# Patient Record
Sex: Female | Born: 1975
Health system: Southern US, Community
[De-identification: ages and names within clinical notes are randomized; demographics above are authoritative.]

## PROBLEM LIST (undated history)

## (undated) DIAGNOSIS — T7840XA Allergy, unspecified, initial encounter: Secondary | ICD-10-CM

## (undated) DIAGNOSIS — F329 Major depressive disorder, single episode, unspecified: Secondary | ICD-10-CM

## (undated) DIAGNOSIS — F32A Depression, unspecified: Secondary | ICD-10-CM

## (undated) DIAGNOSIS — F3281 Premenstrual dysphoric disorder: Secondary | ICD-10-CM

## (undated) DIAGNOSIS — I10 Essential (primary) hypertension: Secondary | ICD-10-CM

## (undated) DIAGNOSIS — F419 Anxiety disorder, unspecified: Secondary | ICD-10-CM

## (undated) HISTORY — DX: Allergy, unspecified, initial encounter: T78.40XA

## (undated) HISTORY — DX: Premenstrual dysphoric disorder: F32.81

## (undated) HISTORY — DX: Major depressive disorder, single episode, unspecified: F32.9

## (undated) HISTORY — PX: TUBAL LIGATION: SHX77

## (undated) HISTORY — DX: Anxiety disorder, unspecified: F41.9

## (undated) HISTORY — DX: Depression, unspecified: F32.A

---

## 2006-06-30 ENCOUNTER — Ambulatory Visit: Payer: Self-pay | Admitting: Obstetrics and Gynecology

## 2006-09-20 ENCOUNTER — Ambulatory Visit: Payer: Self-pay | Admitting: Obstetrics and Gynecology

## 2006-11-28 ENCOUNTER — Ambulatory Visit: Payer: Self-pay | Admitting: Obstetrics and Gynecology

## 2006-12-26 ENCOUNTER — Ambulatory Visit: Payer: Self-pay | Admitting: Obstetrics and Gynecology

## 2007-01-06 ENCOUNTER — Inpatient Hospital Stay: Payer: Self-pay | Admitting: Obstetrics and Gynecology

## 2010-05-28 ENCOUNTER — Observation Stay: Payer: Self-pay | Admitting: Obstetrics & Gynecology

## 2010-06-01 ENCOUNTER — Inpatient Hospital Stay: Payer: Self-pay | Admitting: Obstetrics & Gynecology

## 2010-06-03 LAB — PATHOLOGY REPORT

## 2010-09-22 ENCOUNTER — Emergency Department: Payer: Self-pay | Admitting: Emergency Medicine

## 2011-11-14 LAB — HM PAP SMEAR: HM Pap smear: NORMAL

## 2011-11-15 ENCOUNTER — Other Ambulatory Visit: Payer: Self-pay | Admitting: Psychiatry

## 2011-11-15 LAB — CBC WITH DIFFERENTIAL/PLATELET
Basophil #: 0.1 10*3/uL (ref 0.0–0.1)
Eosinophil %: 2.1 %
HCT: 36.3 % (ref 35.0–47.0)
HGB: 12.1 g/dL (ref 12.0–16.0)
Lymphocyte %: 22.5 %
MCH: 28 pg (ref 26.0–34.0)
MCHC: 33.3 g/dL (ref 32.0–36.0)
Monocyte #: 0.4 10*3/uL (ref 0.0–0.7)
Monocyte %: 6.2 %
Neutrophil #: 4.5 10*3/uL (ref 1.4–6.5)
Platelet: 278 10*3/uL (ref 150–440)
WBC: 6.6 10*3/uL (ref 3.6–11.0)

## 2011-11-15 LAB — SODIUM: Sodium: 142 mmol/L (ref 136–145)

## 2012-07-09 ENCOUNTER — Ambulatory Visit: Payer: Self-pay | Admitting: Internal Medicine

## 2012-07-23 ENCOUNTER — Other Ambulatory Visit (HOSPITAL_COMMUNITY)
Admission: RE | Admit: 2012-07-23 | Discharge: 2012-07-23 | Disposition: A | Discharge status: Home / Self Care | Payer: PRIVATE HEALTH INSURANCE | Source: Ambulatory Visit | Attending: Internal Medicine | Admitting: Internal Medicine

## 2012-07-23 ENCOUNTER — Ambulatory Visit (INDEPENDENT_AMBULATORY_CARE_PROVIDER_SITE_OTHER): Payer: PRIVATE HEALTH INSURANCE | Admitting: Internal Medicine

## 2012-07-23 ENCOUNTER — Encounter: Payer: Self-pay | Admitting: Internal Medicine

## 2012-07-23 VITALS — BP 122/82 | HR 84 | Temp 98.7°F | Resp 12 | Ht 66.0 in | Wt 195.0 lb

## 2012-07-23 DIAGNOSIS — R87810 Cervical high risk human papillomavirus (HPV) DNA test positive: Secondary | ICD-10-CM | POA: Insufficient documentation

## 2012-07-23 DIAGNOSIS — N943 Premenstrual tension syndrome: Secondary | ICD-10-CM

## 2012-07-23 DIAGNOSIS — E669 Obesity, unspecified: Secondary | ICD-10-CM | POA: Insufficient documentation

## 2012-07-23 DIAGNOSIS — Z1239 Encounter for other screening for malignant neoplasm of breast: Secondary | ICD-10-CM

## 2012-07-23 DIAGNOSIS — F3281 Premenstrual dysphoric disorder: Secondary | ICD-10-CM | POA: Insufficient documentation

## 2012-07-23 DIAGNOSIS — Z1151 Encounter for screening for human papillomavirus (HPV): Secondary | ICD-10-CM | POA: Insufficient documentation

## 2012-07-23 DIAGNOSIS — Z01419 Encounter for gynecological examination (general) (routine) without abnormal findings: Secondary | ICD-10-CM | POA: Insufficient documentation

## 2012-07-23 MED ORDER — FLUOXETINE HCL 20 MG PO CAPS: 20.0000 mg | ORAL_CAPSULE | Freq: Every day | ORAL | Status: DC

## 2012-07-23 NOTE — Progress Notes (Signed)
Patient ID: Nancy Leon, female   DOB: 09-11-1976, 36 y.o.   MRN: [id number]    Subjective:     Nancy Leon is a 36 y.o. female and is here for a comprehensive physical exam. The patient reports no problems.  History   Social History  . Marital Status: Married    Spouse Name: N/A    Number of Children: N/A  . Years of Education: N/A   Occupational History  . Not on file.   Social History Main Topics  . Smoking status: Never Smoker   . Smokeless tobacco: Not on file  . Alcohol Use: Yes  . Drug Use: No  . Sexually Active: Not on file   Other Topics Concern  . Not on file   Social History Narrative  . No narrative on file   No health maintenance topics applied.  The following portions of the patient's history were reviewed and updated as appropriate: allergies, current medications, past family history, past medical history, past social history, past surgical history and problem list.  Review of Systems A comprehensive review of systems was negative.   Objective:   BP 122/82  Pulse 84  Temp 98.7 F (37.1 C)  Resp 12  Ht 5\' 6"  (1.676 m)  Wt 195 lb (88.451 kg)  BMI 31.47 kg/m2  SpO2 96%  LMP 07/08/2012  General Appearance:    Alert, cooperative, no distress, appears stated age  Head:    Normocephalic, without obvious abnormality, atraumatic  Eyes:    PERRL, conjunctiva/corneas clear, EOM's intact, fundi    benign, both eyes  Ears:    Normal TM's and external ear canals, both ears  Nose:   Nares normal, septum midline, mucosa normal, no drainage    or sinus tenderness  Throat:   Lips, mucosa, and tongue normal; teeth and gums normal  Neck:   Supple, symmetrical, trachea midline, no adenopathy;    thyroid:  no enlargement/tenderness/nodules; no carotid   bruit or JVD  Back:     Symmetric, no curvature, ROM normal, no CVA tenderness  Lungs:     Clear to auscultation bilaterally, respirations unlabored  Chest Wall:    No tenderness or deformity   Heart:     Regular rate and rhythm, S1 and S2 normal, no murmur, rub   or gallop  Breast Exam:    No tenderness, masses, or nipple abnormality  Abdomen:     Soft, non-tender, bowel sounds active all four quadrants,    no masses, no organomegaly  Genitalia:    Normal female without lesion, discharge or tenderness     Extremities:   Extremities normal, atraumatic, no cyanosis or edema  Pulses:   2+ and symmetric all extremities  Skin:   Skin color, texture, turgor normal, no rashes or lesions  Lymph nodes:   Cervical, supraclavicular, and axillary nodes normal  Neurologic:   CNII-XII intact, normal strength, sensation and reflexes    throughout        Assessment:   Cervical high risk human papillomavirus (HPV) DNA test positive Repeat PAP done today  Premenstrual dysphoric disorder Managed with prozac taken daily  Obesity (BMI 30.0-34.9) She has addressed her weight recently by joining a gym and modifying her diet. Screening fro diabetes, hypothyroidism and hyperlipidemia planned.   Updated Medication List Outpatient Encounter Prescriptions as of 07/23/2012  Medication Sig Dispense Refill  . FLUoxetine (PROZAC) 20 MG capsule Take 1 capsule (20 mg total) by mouth daily.  90 capsule  3  .  loratadine (CLARITIN) 10 MG tablet Take 10 mg by mouth daily.      . [DISCONTINUED] FLUoxetine (PROZAC) 20 MG capsule Take 20 mg by mouth daily.

## 2012-07-23 NOTE — Assessment & Plan Note (Signed)
She has addressed her weight recently by joining a gym and modifying her diet. Screening fro diabetes, hypothyroidism and hyperlipidemia planned.

## 2012-07-23 NOTE — Assessment & Plan Note (Signed)
Managed with prozac taken daily

## 2012-07-23 NOTE — Assessment & Plan Note (Signed)
Repeat PAP done today  

## 2013-04-24 ENCOUNTER — Encounter: Payer: Self-pay | Admitting: Internal Medicine

## 2013-04-24 ENCOUNTER — Ambulatory Visit (INDEPENDENT_AMBULATORY_CARE_PROVIDER_SITE_OTHER): Payer: 59 | Admitting: Internal Medicine

## 2013-04-24 VITALS — BP 142/100 | HR 90 | Temp 99.1°F | Resp 14 | Wt 209.5 lb

## 2013-04-24 DIAGNOSIS — M25539 Pain in unspecified wrist: Secondary | ICD-10-CM

## 2013-04-24 DIAGNOSIS — R609 Edema, unspecified: Secondary | ICD-10-CM

## 2013-04-24 DIAGNOSIS — E669 Obesity, unspecified: Secondary | ICD-10-CM

## 2013-04-24 DIAGNOSIS — M25549 Pain in joints of unspecified hand: Secondary | ICD-10-CM

## 2013-04-24 DIAGNOSIS — IMO0001 Reserved for inherently not codable concepts without codable children: Secondary | ICD-10-CM

## 2013-04-24 DIAGNOSIS — R635 Abnormal weight gain: Secondary | ICD-10-CM

## 2013-04-24 DIAGNOSIS — R030 Elevated blood-pressure reading, without diagnosis of hypertension: Secondary | ICD-10-CM

## 2013-04-24 DIAGNOSIS — E66811 Obesity, class 1: Secondary | ICD-10-CM

## 2013-04-24 DIAGNOSIS — R0683 Snoring: Secondary | ICD-10-CM | POA: Insufficient documentation

## 2013-04-24 DIAGNOSIS — R5381 Other malaise: Secondary | ICD-10-CM

## 2013-04-24 DIAGNOSIS — M25541 Pain in joints of right hand: Secondary | ICD-10-CM

## 2013-04-24 DIAGNOSIS — M25531 Pain in right wrist: Secondary | ICD-10-CM

## 2013-04-24 NOTE — Assessment & Plan Note (Signed)
No signs of synovitis on exam or CTS.

## 2013-04-24 NOTE — Assessment & Plan Note (Signed)
I have addressed  BMI and recommended a low glycemic index diet utilizing smaller more frequent meals to increase metabolism.  I have also recommended that patient start exercising with a goal of 30 minutes of aerobic exercise a minimum of 5 days per week. Screening for lipid disorders, thyroid and diabetes to be done today.   

## 2013-04-24 NOTE — Assessment & Plan Note (Signed)
Ruling our nephrotic syndrome, thyroid dysfunction.  Adding diuretic pending evaluation of labs.

## 2013-04-24 NOTE — Assessment & Plan Note (Signed)
Ruling out thyroid disorder and OSA.

## 2013-04-24 NOTE — Progress Notes (Signed)
Patient ID: Nancy Leon, female   DOB: [birth year], 37 y.o.   MRN: [id number]   Patient Active Problem List   Diagnosis Date Noted  . Other malaise and fatigue 04/24/2013  . Wrist joint pain 04/24/2013  . Cervical high risk human papillomavirus (HPV) DNA test positive 07/23/2012  . Obesity (BMI 30.0-34.9) 07/23/2012  . Premenstrual dysphoric disorder     Subjective:  CC:   Chief Complaint  Patient presents with  . Acute Visit    C/O fatigue, achy joints increased weight. Increased BP     HPI:   Nancy Leon a 37 y.o. female who presents with fatigue accompanied by joint pain ,  Weight gain, elevated blood pressure,  LE edema.  sympotms present for several months.  Right wrist aches.  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  Leg swelling/pitting edema has been worse. Feels her throat has felt swollen,  Had trouble swallowing a liquid capsule 2 months ago,  Caused vomiting,  More recently 2 weeks ago same thing happened with  prozac capsule.    Not eating well and not exercising,  But moved two weeks ago and bought an ellipitical for use at ome.   Has a history of Snoring and daytime somnl=olence,  First reported during pregnancy 3 years ago but not investigated.    Past Medical History  Diagnosis Date  . Allergy   . Depression   . Premenstrual dysphoric disorder     Past Surgical History  Procedure Laterality Date  . Tubal ligation    . Cesarean section  2011       The following portions of the patient's history were reviewed and updated as appropriate: Allergies, current medications, and problem list.    Review of Systems:   Patient denies headache, fevers, malaise, unintentional weight loss, skin rash, eye pain, sinus congestion and sinus pain, sore throat,  hemoptysis , cough, dyspnea,  wheezing, chest pain, palpitations, orthopnea,abdominal pain, nausea, melena, diarrhea, constipation, flank pain, dysuria, hematuria, urinary  Frequency, nocturia, numbness, tingling, seizures,  Focal weakness, Loss of consciousness,  Tremor, insomnia, depression, anxiety, and suicidal ideation.       History   Social History  . Marital Status: Married    Spouse Name: N/A    Number of Children: N/A  . Years of Education: N/A   Occupational History  . Not on file.   Social History Main Topics  . Smoking status: Never Smoker   . Smokeless tobacco: Not on file  . Alcohol Use: Yes  . Drug Use: No  . Sexually Active: Not on file   Other Topics Concern  . Not on file   Social History Narrative  . No narrative on file    Objective:  BP 142/100  Pulse 90  Temp(Src) 99.1 F (37.3 C) (Oral)  Resp 14  Wt 209 lb 8 oz (95.029 kg)  BMI 33.83 kg/m2  SpO2 99%  LMP 04/10/2013  General appearance: obese, alert, cooperative and appears stated age Ears: normal TM's and external ear canals both ears Throat: lips, mucosa, and tongue normal; teeth and gums normal Neck: no adenopathy, no carotid bruit, supple, symmetrical, trachea midline and thyroid not enlarged, symmetric, no tenderness/mass/nodules Back: symmetric, no curvature. ROM normal. No CVA tenderness. Lungs: clear to auscultation bilaterally  Heart: regular rate and rhythm, S1, S2 normal, no murmur, click, rub or gallop Abdomen: soft, non-tender; bowel sounds normal; no masses,  no organomegaly Pulses: 2+ and symmetric Skin: Skin color, texture, turgor normal. No rashes or lesions. 1+ pittting edema Lymph nodes: Cervical, supraclavicular, and axillary nodes normal.  Assessment and Plan:  Obesity (BMI 30.0-34.9) I have addressed  BMI and recommended a low glycemic index diet utilizing smaller more frequent meals to increase metabolism.  I have also recommended that patient start exercising with a goal of 30 minutes of  aerobic exercise a minimum of 5 days per week. Screening for lipid disorders, thyroid and diabetes to be done today.    Other malaise and fatigue Ruling out thyroid disorder and OSA.  Wrist joint pain No signs of synovitis on exam or CTS.   Edema Ruling our nephrotic syndrome, thyroid dysfunction.  Adding diuretic pending evaluation of labs.    Updated Medication List Outpatient Encounter Prescriptions as of 04/24/2013  Medication Sig Dispense Refill  . FLUoxetine (PROZAC) 20 MG capsule Take 1 capsule (20 mg total) by mouth daily.  90 capsule  3  . loratadine (CLARITIN) 10 MG tablet Take 10 mg by mouth daily.       No facility-administered encounter medications on file as of 04/24/2013.

## 2013-04-24 NOTE — Patient Instructions (Addendum)
Labs today to check for thyroid dysfunction   If negative,  I recommend ruliing out sleep apnea with a sleep study  I will call in a diuretic once I see your labs.

## 2013-04-25 ENCOUNTER — Encounter: Payer: Self-pay | Admitting: Internal Medicine

## 2013-04-25 LAB — COMPREHENSIVE METABOLIC PANEL
ALT: 30 U/L (ref 0–35)
AST: 24 U/L (ref 0–37)
Alkaline Phosphatase: 65 U/L (ref 39–117)
BUN: 13 mg/dL (ref 6–23)
Calcium: 9.9 mg/dL (ref 8.4–10.5)
Chloride: 103 mEq/L (ref 96–112)
Creatinine, Ser: 0.8 mg/dL (ref 0.4–1.2)
Potassium: 4.2 mEq/L (ref 3.5–5.1)

## 2013-04-25 LAB — SEDIMENTATION RATE: Sed Rate: 33 mm/hr — ABNORMAL HIGH (ref 0–22)

## 2013-04-25 LAB — MICROALBUMIN / CREATININE URINE RATIO
Creatinine,U: 90.6 mg/dL
Microalb Creat Ratio: 0.9 mg/g (ref 0.0–30.0)

## 2013-04-25 LAB — TSH+FREE T4: Free T4: 0.91 ng/dL (ref 0.82–1.77)

## 2013-04-25 LAB — C-REACTIVE PROTEIN: CRP: 0.7 mg/dL (ref 0.5–20.0)

## 2013-04-26 ENCOUNTER — Encounter: Payer: Self-pay | Admitting: Internal Medicine

## 2013-04-26 DIAGNOSIS — R0683 Snoring: Secondary | ICD-10-CM

## 2013-05-22 ENCOUNTER — Ambulatory Visit (INDEPENDENT_AMBULATORY_CARE_PROVIDER_SITE_OTHER): Payer: 59 | Admitting: Internal Medicine

## 2013-05-22 ENCOUNTER — Encounter: Payer: Self-pay | Admitting: Internal Medicine

## 2013-05-22 VITALS — BP 142/98 | HR 72 | Temp 98.3°F | Resp 14 | Ht 66.0 in | Wt 207.5 lb

## 2013-05-22 DIAGNOSIS — R5381 Other malaise: Secondary | ICD-10-CM

## 2013-05-22 DIAGNOSIS — E669 Obesity, unspecified: Secondary | ICD-10-CM

## 2013-05-22 DIAGNOSIS — I10 Essential (primary) hypertension: Secondary | ICD-10-CM | POA: Insufficient documentation

## 2013-05-22 DIAGNOSIS — M129 Arthropathy, unspecified: Secondary | ICD-10-CM

## 2013-05-22 DIAGNOSIS — M25539 Pain in unspecified wrist: Secondary | ICD-10-CM

## 2013-05-22 DIAGNOSIS — M1990 Unspecified osteoarthritis, unspecified site: Secondary | ICD-10-CM

## 2013-05-22 MED ORDER — HYDROCHLOROTHIAZIDE 25 MG PO TABS: 25.0000 mg | ORAL_TABLET | Freq: Every day | ORAL | Status: DC

## 2013-05-22 MED ORDER — ZOLPIDEM TARTRATE 5 MG PO TABS: 5.0000 mg | ORAL_TABLET | Freq: Every evening | ORAL | Status: DC | PRN

## 2013-05-22 NOTE — Patient Instructions (Addendum)

## 2013-05-25 ENCOUNTER — Encounter: Payer: Self-pay | Admitting: Internal Medicine

## 2013-05-25 DIAGNOSIS — E669 Obesity, unspecified: Secondary | ICD-10-CM | POA: Insufficient documentation

## 2013-05-25 NOTE — Assessment & Plan Note (Signed)
With wt gain of 12 lbs in less than one year.  I have addressed  BMI and recommended wt loss of 10% of body weigh over the next 6 months using a low glycemic index diet and regular exercise a minimum of 5 days per week.

## 2013-05-25 NOTE — Assessment & Plan Note (Signed)
Starting HCTZ for hypertension and concurrent LE edema.

## 2013-05-25 NOTE — Assessment & Plan Note (Signed)
Improved with NSAIDs

## 2013-05-25 NOTE — Assessment & Plan Note (Signed)
No signs of anemia, thyroid or renal disorder ,  Suspect etiology is OSA. Sleep study ordered.

## 2013-05-25 NOTE — Progress Notes (Signed)
Patient ID: Nancy Leon, female   DOB: Oct 23, 1975, 37 y.o.   MRN: [id number]  Patient Active Problem List   Diagnosis Date Noted  . Essential hypertension, benign 05/22/2013  . Other malaise and fatigue 04/24/2013  . Wrist joint pain 04/24/2013  . Edema 04/24/2013  . Cervical high risk human papillomavirus (HPV) DNA test positive 07/23/2012  . Obesity (BMI 30.0-34.9) 07/23/2012  . Premenstrual dysphoric disorder     Subjective:  CC:   Chief Complaint  Patient presents with  . Follow-up    4 week    HPI:   Nancy Leon a 37 y.o. female who presents for one month Follow up on recent development of polyarthralgias, edema , weight gain and new onset hypertension.  All labs were normal, and sleep study is to be done next week for history of snoring, daytime somnolence and obesity . Joint pain has improved.      Past Medical History  Diagnosis Date  . Allergy   . Depression   . Premenstrual dysphoric disorder     Past Surgical History  Procedure Laterality Date  . Tubal ligation    . Cesarean section  2011       The following portions of the patient's history were reviewed and updated as appropriate: Allergies, current medications, and problem list.    Review of Systems:   12 Pt  review of systems was negative except those addressed in the HPI,     History   Social History  . Marital Status: Married    Spouse Name: N/A    Number of Children: N/A  . Years of Education: N/A   Occupational History  . Not on file.   Social History Main Topics  . Smoking status: Never Smoker   . Smokeless tobacco: Not on file  . Alcohol Use: Yes  . Drug Use: No  . Sexual Activity: Not on file   Other Topics Concern  . Not on file   Social History Narrative  . No narrative on file    Objective:  Filed Vitals:   05/22/13 1535  BP: 142/98  Pulse: 72  Temp: 98.3 F (36.8 C)  Resp: 14     General appearance: alert, cooperative and appears stated  age Ears: normal TM's and external ear canals both ears Throat: lips, mucosa, and tongue normal; teeth and gums normal Neck: no adenopathy, no carotid bruit, supple, symmetrical, trachea midline and thyroid not enlarged, symmetric, no tenderness/mass/nodules Back: symmetric, no curvature. ROM normal. No CVA tenderness. Lungs: clear to auscultation bilaterally Heart: regular rate and rhythm, S1, S2 normal, no murmur, click, rub or gallop Abdomen: soft, non-tender; bowel sounds normal; no masses,  no organomegaly Pulses: 2+ and symmetric Skin: Skin color, texture, turgor normal. No rashes or lesions Lymph nodes: Cervical, supraclavicular, and axillary nodes normal.  Assessment and Plan:  Other malaise and fatigue No signs of anemia, thyroid or renal disorder ,  Suspect etiology is OSA. Sleep study ordered.   Wrist joint pain Improved with NSAIDs  Essential hypertension, benign Starting HCTZ for hypertension and concurrent LE edema.    Updated Medication List Outpatient Encounter Prescriptions as of 05/22/2013  Medication Sig Dispense Refill  . FLUoxetine (PROZAC) 20 MG capsule Take 1 capsule (20 mg total) by mouth daily.  90 capsule  3  . loratadine (CLARITIN) 10 MG tablet Take 10 mg by mouth daily.      . hydrochlorothiazide (HYDRODIURIL) 25 MG tablet Take 1 tablet (25 mg total) by mouth daily.  90 tablet  3  . zolpidem (AMBIEN) 5 MG tablet Take 1 tablet (5 mg total) by mouth at bedtime as needed for sleep.  15 tablet  1   No facility-administered encounter medications on file as of 05/22/2013.     Orders Placed This Encounter  Procedures  . Iron and TIBC  . Ferritin    Return in about 3 months (around 08/21/2013).

## 2013-05-30 ENCOUNTER — Ambulatory Visit: Payer: Self-pay | Admitting: Internal Medicine

## 2013-06-04 ENCOUNTER — Telehealth: Payer: Self-pay | Admitting: Internal Medicine

## 2013-06-04 DIAGNOSIS — R5381 Other malaise: Secondary | ICD-10-CM

## 2013-06-04 NOTE — Telephone Encounter (Signed)
Her sleep study report was incomplete so I am asking for another copy. But it appears they noted upper airway obstruction as a cause for her snoring,  So she would vbenefit from an ENT referral.  Does she have a preference who I send her to ?

## 2013-06-05 NOTE — Telephone Encounter (Signed)
Left message for patient to return my call.

## 2013-06-12 NOTE — Telephone Encounter (Signed)
Left message for patient to call office.  

## 2013-06-16 ENCOUNTER — Telehealth: Payer: Self-pay | Admitting: Internal Medicine

## 2013-06-16 DIAGNOSIS — R5381 Other malaise: Secondary | ICD-10-CM

## 2013-06-16 NOTE — Assessment & Plan Note (Signed)
Sleep study apparently ruled out OSA but noted increased REM (rebound( and snoring due to upper airway obstruction

## 2013-07-01 ENCOUNTER — Encounter: Payer: Self-pay | Admitting: Internal Medicine

## 2013-07-25 ENCOUNTER — Other Ambulatory Visit: Payer: Self-pay

## 2013-08-21 ENCOUNTER — Ambulatory Visit: Payer: 59 | Admitting: Internal Medicine

## 2013-09-04 ENCOUNTER — Encounter: Payer: Self-pay | Admitting: Internal Medicine

## 2013-09-04 ENCOUNTER — Ambulatory Visit (INDEPENDENT_AMBULATORY_CARE_PROVIDER_SITE_OTHER): Payer: 59 | Admitting: Internal Medicine

## 2013-09-04 VITALS — BP 124/74 | HR 90 | Temp 98.5°F | Wt 202.0 lb

## 2013-09-04 DIAGNOSIS — Z79899 Other long term (current) drug therapy: Secondary | ICD-10-CM

## 2013-09-04 DIAGNOSIS — E785 Hyperlipidemia, unspecified: Secondary | ICD-10-CM

## 2013-09-04 DIAGNOSIS — R5381 Other malaise: Secondary | ICD-10-CM

## 2013-09-04 DIAGNOSIS — I10 Essential (primary) hypertension: Secondary | ICD-10-CM

## 2013-09-04 DIAGNOSIS — E669 Obesity, unspecified: Secondary | ICD-10-CM

## 2013-09-04 MED ORDER — AZELASTINE-FLUTICASONE 137-50 MCG/ACT NA SUSP: 2.0000 | Freq: Every day | NASAL | Status: DC

## 2013-09-04 NOTE — Progress Notes (Signed)
Pre visit review using our clinic review tool, if applicable. No additional management support is needed unless otherwise documented below in the visit note. 

## 2013-09-07 NOTE — Assessment & Plan Note (Signed)
Sleep study apparently ruled out OSA but noted increased REM (rebound( and snoring due to upper airway obstruction.  Suggested treating sinus congestion with steroid/antihistamine nasal spray. Trial of azelastine recommended Also discussed suspending wine for a few days to see if the congestion is coming from a sulfite allergy.

## 2013-09-07 NOTE — Assessment & Plan Note (Signed)
I have encouraged her to continue low glycemic index diet utilizing smaller more frequent meals to increase metabolism.  I have also recommended that patient start exercising with a goal of 30 minutes of aerobic exercise a minimum of 5 days per week. Screening for lipid disorders, thyroid and diabetes has been done

## 2013-09-07 NOTE — Progress Notes (Signed)
Patient ID: Nancy Leon, female   DOB: 1976/07/18, 37 y.o.   MRN: [id number]   Patient Active Problem List   Diagnosis Date Noted  . Essential hypertension, benign 05/22/2013  . Other malaise and fatigue 04/24/2013  . Wrist joint pain 04/24/2013  . Edema 04/24/2013  . Cervical high risk human papillomavirus (HPV) DNA test positive 07/23/2012  . Obesity (BMI 30.0-34.9) 07/23/2012  . Premenstrual dysphoric disorder     Subjective:  CC:   Chief Complaint  Patient presents with  . Follow-up    HPI:   Nancy Leon a 37 y.o. female who presents for follow up on recent sleep study ,  Hypertension and fatigue. Her sleep study was essentially normal but did note upper airway resistance leading to excessive snoring. She does note that she often feels congested when she goes to bed at night. She does drink a glass or 2 of wine on most evenings. She has no history of allergic rhinitis. She feels generally well. She is tolerating her blood pressure medications. She has lost 9 pounds since August. She is exercising somewhat regularly.  Past Medical History  Diagnosis Date  . Allergy   . Depression   . Premenstrual dysphoric disorder     Past Surgical History  Procedure Laterality Date  . Tubal ligation    . Cesarean section  2011       The following portions of the patient's history were reviewed and updated as appropriate: Allergies, current medications, and problem list.    Review of Systems:   12 Pt  review of systems was negative except those addressed in the HPI,     History   Social History  . Marital Status: Married    Spouse Name: N/A    Number of Children: N/A  . Years of Education: N/A   Occupational History  . Not on file.   Social History Main Topics  . Smoking status: Never Smoker   . Smokeless tobacco: Not on file  . Alcohol Use: Yes  . Drug Use: No  . Sexual Activity: Not on file   Other Topics Concern  . Not on file   Social  History Narrative  . No narrative on file    Objective:  Filed Vitals:   09/04/13 1658  BP: 124/74  Pulse: 90  Temp: 98.5 F (36.9 C)     General appearance: alert, cooperative and appears stated age Ears: normal TM's and external ear canals both ears Throat: lips, mucosa, and tongue normal; teeth and gums normal Neck: no adenopathy, no carotid bruit, supple, symmetrical, trachea midline and thyroid not enlarged, symmetric, no tenderness/mass/nodules Back: symmetric, no curvature. ROM normal. No CVA tenderness. Lungs: clear to auscultation bilaterally Heart: regular rate and rhythm, S1, S2 normal, no murmur, click, rub or gallop Abdomen: soft, non-tender; bowel sounds normal; no masses,  no organomegaly Pulses: 2+ and symmetric Skin: Skin color, texture, turgor normal. No rashes or lesions Lymph nodes: Cervical, supraclavicular, and axillary nodes normal.  Assessment and Plan:  Other malaise and fatigue Sleep study apparently ruled out OSA but noted increased REM (rebound( and snoring due to upper airway obstruction.  Suggested treating sinus congestion with steroid/antihistamine nasal spray. Trial of azelastine recommended Also discussed suspending wine for a few days to see if the congestion is coming from a sulfite allergy.  Obesity (BMI 30.0-34.9) I have encouraged her to continue low glycemic index diet utilizing smaller more frequent meals to increase metabolism.  I have also recommended that patient  start exercising with a goal of 30 minutes of aerobic exercise a minimum of 5 days per week. Screening for lipid disorders, thyroid and diabetes has been done   Essential hypertension, benign Well controlled on current regimen. Renal function stable, no changes today. Lab Results  Component Value Date   NA 138 04/24/2013   K 4.2 04/24/2013   CL 103 04/24/2013   CO2 28 04/24/2013   Lab Results  Component Value Date   CREATININE 0.8 04/24/2013      Updated Medication  List Outpatient Encounter Prescriptions as of 09/04/2013  Medication Sig  . FLUoxetine (PROZAC) 20 MG capsule Take 1 capsule (20 mg total) by mouth daily.  . hydrochlorothiazide (HYDRODIURIL) 25 MG tablet Take 1 tablet (25 mg total) by mouth daily.  Marland Kitchen loratadine (CLARITIN) 10 MG tablet Take 10 mg by mouth daily.  . Azelastine-Fluticasone (DYMISTA) 137-50 MCG/ACT SUSP Place 2 Squirts into the nose daily. In each nostril  . zolpidem (AMBIEN) 5 MG tablet Take 1 tablet (5 mg total) by mouth at bedtime as needed for sleep.     Orders Placed This Encounter  Procedures  . Comprehensive metabolic panel  . Lipid panel    No Follow-up on file.

## 2013-09-07 NOTE — Assessment & Plan Note (Signed)
Well controlled on current regimen. Renal function stable, no changes today. Lab Results  Component Value Date   NA 138 04/24/2013   K 4.2 04/24/2013   CL 103 04/24/2013   CO2 28 04/24/2013   Lab Results  Component Value Date   CREATININE 0.8 04/24/2013

## 2014-06-03 ENCOUNTER — Other Ambulatory Visit: Payer: Self-pay | Admitting: Internal Medicine

## 2014-09-01 ENCOUNTER — Other Ambulatory Visit: Payer: Self-pay | Admitting: Internal Medicine

## 2014-09-15 ENCOUNTER — Telehealth: Payer: Self-pay | Admitting: *Deleted

## 2014-09-15 MED ORDER — HYDROCHLOROTHIAZIDE 25 MG PO TABS: 25.0000 mg | ORAL_TABLET | Freq: Every day | ORAL | Status: DC

## 2014-09-15 NOTE — Telephone Encounter (Signed)
Pt left VM, needing refill BP med. Called pt, advised on need for appt. Scheduled 10/15/14. Rx sent to pharmacy by escript

## 2014-10-13 ENCOUNTER — Other Ambulatory Visit: Payer: Self-pay | Admitting: Internal Medicine

## 2014-10-15 ENCOUNTER — Ambulatory Visit: Payer: 59 | Admitting: Internal Medicine

## 2014-11-13 ENCOUNTER — Ambulatory Visit (INDEPENDENT_AMBULATORY_CARE_PROVIDER_SITE_OTHER): Payer: 59 | Admitting: Internal Medicine

## 2014-11-13 ENCOUNTER — Encounter: Payer: Self-pay | Admitting: Internal Medicine

## 2014-11-13 VITALS — BP 120/86 | HR 84 | Temp 98.7°F | Resp 14 | Ht 66.0 in | Wt 199.4 lb

## 2014-11-13 DIAGNOSIS — Z23 Encounter for immunization: Secondary | ICD-10-CM

## 2014-11-13 DIAGNOSIS — E669 Obesity, unspecified: Secondary | ICD-10-CM

## 2014-11-13 DIAGNOSIS — E785 Hyperlipidemia, unspecified: Secondary | ICD-10-CM | POA: Diagnosis not present

## 2014-11-13 DIAGNOSIS — I10 Essential (primary) hypertension: Secondary | ICD-10-CM | POA: Diagnosis not present

## 2014-11-13 DIAGNOSIS — Z79899 Other long term (current) drug therapy: Secondary | ICD-10-CM | POA: Diagnosis not present

## 2014-11-13 LAB — LIPID PANEL
CHOL/HDL RATIO: 4
Cholesterol: 203 mg/dL — ABNORMAL HIGH (ref 0–200)
HDL: 52.5 mg/dL (ref >39.00)
LDL CALC: 122 mg/dL — AB (ref 0–99)
NonHDL: 150.5
TRIGLYCERIDES: 141 mg/dL (ref 0.0–149.0)
VLDL: 28.2 mg/dL (ref 0.0–40.0)

## 2014-11-13 LAB — COMPREHENSIVE METABOLIC PANEL
ALT: 21 U/L (ref 0–35)
AST: 18 U/L (ref 0–37)
Albumin: 4.4 g/dL (ref 3.5–5.2)
Alkaline Phosphatase: 65 U/L (ref 39–117)
BILIRUBIN TOTAL: 0.4 mg/dL (ref 0.2–1.2)
BUN: 12 mg/dL (ref 6–23)
CALCIUM: 10.1 mg/dL (ref 8.4–10.5)
CO2: 31 meq/L (ref 19–32)
CREATININE: 0.79 mg/dL (ref 0.40–1.20)
Chloride: 101 mEq/L (ref 96–112)
GFR: 85.76 mL/min (ref >60.00)
Glucose, Bld: 89 mg/dL (ref 70–99)
Potassium: 4.6 mEq/L (ref 3.5–5.1)
Sodium: 138 mEq/L (ref 135–145)
Total Protein: 7.9 g/dL (ref 6.0–8.3)

## 2014-11-13 NOTE — Progress Notes (Signed)
Patient ID: Nancy Leon, female   DOB: 10-09-1975, 39 y.o.   MRN: [id number]    Patient Active Problem List   Diagnosis Date Noted  . Essential hypertension, benign 05/22/2013  . Snoring 04/24/2013  . Edema 04/24/2013  . Cervical high risk human papillomavirus (HPV) DNA test positive 07/23/2012  . Obesity (BMI 30.0-34.9) 07/23/2012  . Premenstrual dysphoric disorder     Subjective:  CC:   Chief Complaint  Patient presents with  . Follow-up    BP medication refills/ patient not fasting.    HPI:   Nancy Leon is a 39 y.o. female who presents for  Follow up on chronic issues including hypertension and obesity .  She was last seen December 2014.  She has begun a regular exercise program last week and is now following a diet plan by [employer].  Last home wt was 190  No longer taking prozac for premenstrual dysphoric disorder.  Symptoms are improved.   Her family history has been reviewed and updated.   Screening exams have been reviewed and she is due for  PAP TH   Past Medical History  Diagnosis Date  . Allergy   . Depression   . Premenstrual dysphoric disorder     Past Surgical History  Procedure Laterality Date  . Tubal ligation    . Cesarean section  2011       The following portions of the patient's history were reviewed and updated as appropriate: Allergies, current medications, and problem list.    Review of Systems:   Patient denies headache, fevers, malaise, unintentional weight loss, skin rash, eye pain, sinus congestion and sinus pain, sore throat, dysphagia,  hemoptysis , cough, dyspnea, wheezing, chest pain, palpitations, orthopnea, edema, abdominal pain, nausea, melena, diarrhea, constipation, flank pain, dysuria, hematuria, urinary  Frequency, nocturia, numbness, tingling, seizures,  Focal weakness, Loss of consciousness,  Tremor, insomnia, depression, anxiety, and suicidal ideation.     History   Social History  . Marital Status:  Married    Spouse Name: N/A  . Number of Children: N/A  . Years of Education: N/A   Occupational History  . Not on file.   Social History Main Topics  . Smoking status: Never Smoker   . Smokeless tobacco: Not on file  . Alcohol Use: Yes  . Drug Use: No  . Sexual Activity: Not on file   Other Topics Concern  . Not on file   Social History Narrative    Objective:  Filed Vitals:   11/13/14 0907  BP: 120/86  Pulse: 84  Temp: 98.7 F (37.1 C)  Resp: 14     General appearance: alert, cooperative and appears stated age Ears: normal TM's and external ear canals both ears Throat: lips, mucosa, and tongue normal; teeth and gums normal Neck: no adenopathy, no carotid bruit, supple, symmetrical, trachea midline and thyroid not enlarged, symmetric, no tenderness/mass/nodules Back: symmetric, no curvature. ROM normal. No CVA tenderness. Lungs: clear to auscultation bilaterally Heart: regular rate and rhythm, S1, S2 normal, no murmur, click, rub or gallop Abdomen: soft, non-tender; bowel sounds normal; no masses,  no organomegaly Pulses: 2+ and symmetric Skin: Skin color, texture, turgor normal. No rashes or lesions Lymph nodes: Cervical, supraclavicular, and axillary nodes normal.  Assessment and Plan:  Essential hypertension, benign His BP is not at goal currently.  Medications reviewed and compliance assessed via patient report.  Renal function and electolytes assessed.  Use of NSAIDs, oral decongestants and other medications/supplements reviewed  Lab Results  Component Value Date   NA 138 11/13/2014   K 4.6 11/13/2014   CL 101 11/13/2014   CO2 31 11/13/2014   Lab Results  Component Value Date   CREATININE 0.79 11/13/2014     the following changes were made to his regimen: check home BPs and continue HCTZ for consistent readings < 130/80, if higher will add losartan   Obesity (BMI 30.0-34.9) I have congratulated her in reduction of   BMI and encouraged  Continued weight loss with goal of 10% of body weigh over the next 6 months using a low glycemic index diet and regular exercise a minimum of 5 days per week.    A total of 25 minutes of face to face time was spent with patient more than half of which was spent in counselling and coordination of care    Updated Medication List Outpatient Encounter Prescriptions as of 11/13/2014  Medication Sig  . hydrochlorothiazide (HYDRODIURIL) 25 MG tablet TAKE 1 TABLET BY MOUTH DAILY.  Marland Kitchen loratadine (CLARITIN) 10 MG tablet Take 10 mg by mouth daily.  . [DISCONTINUED] Azelastine-Fluticasone (DYMISTA) 137-50 MCG/ACT SUSP Place 2 Squirts into the nose daily. In each nostril (Patient not taking: Reported on 11/13/2014)  . [DISCONTINUED] FLUoxetine (PROZAC) 20 MG capsule Take 1 capsule (20 mg total) by mouth daily. (Patient not taking: Reported on 11/13/2014)  . [DISCONTINUED] zolpidem (AMBIEN) 5 MG tablet Take 1 tablet (5 mg total) by mouth at bedtime as needed for sleep. (Patient not taking: Reported on 11/13/2014)     Orders Placed This Encounter  Procedures  . Tdap vaccine greater than or equal to 40yo IM  . HM PAP SMEAR    No Follow-up on file.

## 2014-11-13 NOTE — Patient Instructions (Signed)
Please check your BP at home once day over the next few days.  Drop by on Monday with your home BP machine to compare your readings with ours.  Ask for Juliann Pulse   Our goal is 120/70,  But as long as it is < 130/80 we will continue HCTZ

## 2014-11-13 NOTE — Progress Notes (Signed)
Pre-visit discussion using our clinic review tool. No additional management support is needed unless otherwise documented below in the visit note.  

## 2014-11-14 ENCOUNTER — Encounter: Payer: Self-pay | Admitting: Internal Medicine

## 2014-11-15 ENCOUNTER — Encounter: Payer: Self-pay | Admitting: Internal Medicine

## 2014-11-15 NOTE — Assessment & Plan Note (Addendum)
His BP is not at goal currently.  Medications reviewed and compliance assessed via patient report.  Renal function and electolytes assessed.  Use of NSAIDs, oral decongestants and other medications/supplements reviewed    Lab Results  Component Value Date   NA 138 11/13/2014   K 4.6 11/13/2014   CL 101 11/13/2014   CO2 31 11/13/2014   Lab Results  Component Value Date   CREATININE 0.79 11/13/2014     the following changes were made to his regimen: check home BPs and continue HCTZ for consistent readings < 130/80, if higher will add losartan

## 2014-11-15 NOTE — Assessment & Plan Note (Signed)
I have congratulated her in reduction of   BMI and encouraged  Continued weight loss with goal of 10% of body weigh over the next 6 months using a low glycemic index diet and regular exercise a minimum of 5 days per week.    

## 2014-11-17 ENCOUNTER — Other Ambulatory Visit: Payer: Self-pay | Admitting: Internal Medicine

## 2014-11-29 ENCOUNTER — Ambulatory Visit: Payer: Self-pay | Admitting: Family Medicine

## 2014-12-11 NOTE — Telephone Encounter (Signed)
Mailed unread message to pt  

## 2014-12-12 ENCOUNTER — Other Ambulatory Visit: Payer: Self-pay | Admitting: Internal Medicine

## 2015-02-15 ENCOUNTER — Encounter: Payer: Self-pay | Admitting: Gynecology

## 2015-02-15 ENCOUNTER — Ambulatory Visit
Admission: EM | Admit: 2015-02-15 | Discharge: 2015-02-15 | Disposition: A | Discharge status: Home / Self Care | Payer: 59 | Attending: Family Medicine | Admitting: Family Medicine

## 2015-02-15 DIAGNOSIS — J0110 Acute frontal sinusitis, unspecified: Secondary | ICD-10-CM | POA: Diagnosis not present

## 2015-02-15 DIAGNOSIS — J3089 Other allergic rhinitis: Secondary | ICD-10-CM

## 2015-02-15 HISTORY — DX: Essential (primary) hypertension: I10

## 2015-02-15 MED ORDER — AMOXICILLIN-POT CLAVULANATE 875-125 MG PO TABS: 1.0000 | ORAL_TABLET | Freq: Two times a day (BID) | ORAL | Status: DC

## 2015-02-15 MED ORDER — FLUTICASONE PROPIONATE 50 MCG/ACT NA SUSP: 2.0000 | Freq: Every day | NASAL | Status: DC

## 2015-02-15 NOTE — ED Notes (Signed)
Pt. C/o sinus infection x 2 weeks. Sinus pain / drainage.

## 2015-02-18 NOTE — ED Provider Notes (Signed)
CSN: [id number]     Arrival date & time 02/15/15  21 History   First MD Initiated Contact with Patient 02/15/15 1814     Chief Complaint  Patient presents with  . Facial Pain   (Consider location/radiation/quality/duration/timing/severity/associated sxs/prior Treatment) HPI  39 yo F with 2 weeks of sinus congestion, ears full, now with facial pain, tooth pain, pain with percussion sinuses, malaise .Cough increasing; evening fevers. Has had seasonal allergies but not taking Rx consistently-nasal congestion persistant  Past Medical History  Diagnosis Date  . Allergy   . Depression   . Premenstrual dysphoric disorder   . Hypertension    Past Surgical History  Procedure Laterality Date  . Tubal ligation    . Cesarean section  2011   Family History  Problem Relation Age of Onset  . Diabetes Mother   . Hyperlipidemia Mother   . Cancer Mother 31    colon ca  . Asthma Father   . Hypertension Father    History  Substance Use Topics  . Smoking status: Never Smoker   . Smokeless tobacco: Not on file  . Alcohol Use: Yes   OB History    No data available     Review of Systems Review of 10 systems negative other than as referenced in HPI  Allergies  Review of patient's allergies indicates no known allergies.  Home Medications   Prior to Admission medications   Medication Sig Start Date End Date Taking? Authorizing Provider  amoxicillin-clavulanate (AUGMENTIN) 875-125 MG per tablet Take 1 tablet by mouth every 12 (twelve) hours. 02/15/15   Jan Fireman, PA-C  fluticasone [facility]) 50 MCG/ACT nasal spray Place 2 sprays into both nostrils daily. 02/15/15   Jan Fireman, PA-C  hydrochlorothiazide (HYDRODIURIL) 25 MG tablet TAKE 1 TABLET BY MOUTH EVERY DAY 12/15/14   Crecencio Mc, MD  loratadine (CLARITIN) 10 MG tablet Take 10 mg by mouth daily.    Historical Provider, MD   BP 112/64 mmHg  Pulse 83  Temp(Src) 96.2 F (35.7 C) (Tympanic)  Ht 5\' 3"  (1.6 m)  Wt 190 lb (86.183  kg)  BMI 33.67 kg/m2  SpO2 100%  LMP 01/25/2015 Physical Exam Constitutional -alert and oriented, mild distress,malaise Head-atraumatic Eyes- conjunctiva normal, EOMI ,conjugate gaze Nose- nasal /sinus congestion and rhinorrhea Mouth/throat- mucous membranes moist ,oropharynx erythematous,  Neck- supple without glandular enlargement CV- regular rate, grossly normal heart sounds,  Resp-no distress, normal respiratory effort,clear to auscultation bilaterally,cough GI- ,no distention GU-  not examined MSK- no lower extremity tenderness nor edema, ambulatory Neuro- normal speech and language,  Skin-warm,dry ,intact; no rash noted Psych- speech and behavior grossly normal ED Course  Procedures (including critical care time) Labs Review Labs Reviewed - No data to display  Imaging Review No results found.   MDM   1. Acute frontal sinusitis, recurrence not specified   2. Environmental and seasonal allergies    Plan: 1.  Diagnosis reviewed with patient-seasonal allergies suspected as influential -need to take Rx consistently from early pollen outbreak until winter hard frost-[place] area has significant pollen. Keep nasal /sinus tracts clear and not inflamed-not as susceptible to infection 2. Rx Augmentin, fluticasone nasal : risks, benefits, potential side effects reviewed with patient 3. Recommend supportive treatment with OTC loratadine/guafenesin/tylenol/ibuprofen/fluids 4.Hydrate , steamy showers, vaporizer to loosen secretions 5. F/u prn if symptoms worsen or don't improve;  6. Questions fielded, expectations and recommendations reviewed. Patient expresses understanding. Will return to [facility] with questions, concern or exacerbation.  Jan Fireman, PA-C 02/18/15 1249

## 2015-06-20 ENCOUNTER — Other Ambulatory Visit: Payer: Self-pay | Admitting: Internal Medicine

## 2015-07-09 ENCOUNTER — Encounter: Payer: 59 | Admitting: Internal Medicine

## 2015-07-15 ENCOUNTER — Other Ambulatory Visit: Payer: Self-pay | Admitting: Internal Medicine

## 2015-07-15 NOTE — Telephone Encounter (Signed)
Refill for 30 days only.  OFFICE VISIT Nov 9th must be kept prior to any more refills

## 2015-07-15 NOTE — Telephone Encounter (Signed)
Patient cancelled last appointment, prior one was in February, please advise refill?

## 2015-07-29 ENCOUNTER — Ambulatory Visit (INDEPENDENT_AMBULATORY_CARE_PROVIDER_SITE_OTHER): Payer: 59 | Admitting: Internal Medicine

## 2015-07-29 ENCOUNTER — Encounter: Payer: Self-pay | Admitting: Internal Medicine

## 2015-07-29 ENCOUNTER — Other Ambulatory Visit (HOSPITAL_COMMUNITY)
Admission: RE | Admit: 2015-07-29 | Discharge: 2015-07-29 | Disposition: A | Discharge status: Home / Self Care | Payer: 59 | Source: Ambulatory Visit | Attending: Internal Medicine | Admitting: Internal Medicine

## 2015-07-29 VITALS — BP 110/80 | HR 83 | Temp 98.5°F | Resp 18 | Ht 64.0 in | Wt 194.5 lb

## 2015-07-29 DIAGNOSIS — Z124 Encounter for screening for malignant neoplasm of cervix: Secondary | ICD-10-CM | POA: Diagnosis not present

## 2015-07-29 DIAGNOSIS — Z0000 Encounter for general adult medical examination without abnormal findings: Secondary | ICD-10-CM

## 2015-07-29 DIAGNOSIS — E66811 Obesity, class 1: Secondary | ICD-10-CM

## 2015-07-29 DIAGNOSIS — Z01419 Encounter for gynecological examination (general) (routine) without abnormal findings: Secondary | ICD-10-CM | POA: Insufficient documentation

## 2015-07-29 DIAGNOSIS — R87810 Cervical high risk human papillomavirus (HPV) DNA test positive: Secondary | ICD-10-CM

## 2015-07-29 DIAGNOSIS — E785 Hyperlipidemia, unspecified: Secondary | ICD-10-CM

## 2015-07-29 DIAGNOSIS — E669 Obesity, unspecified: Secondary | ICD-10-CM

## 2015-07-29 DIAGNOSIS — Z113 Encounter for screening for infections with a predominantly sexual mode of transmission: Secondary | ICD-10-CM

## 2015-07-29 DIAGNOSIS — Z1239 Encounter for other screening for malignant neoplasm of breast: Secondary | ICD-10-CM

## 2015-07-29 DIAGNOSIS — I10 Essential (primary) hypertension: Secondary | ICD-10-CM

## 2015-07-29 MED ORDER — HYDROCHLOROTHIAZIDE 25 MG PO TABS: 25.0000 mg | ORAL_TABLET | Freq: Every day | ORAL | Status: DC

## 2015-07-29 NOTE — Progress Notes (Signed)
Pre-visit discussion using our clinic review tool. No additional management support is needed unless otherwise documented below in the visit note.  

## 2015-07-29 NOTE — Patient Instructions (Addendum)
Your PAP smear will be read in 3-4 days  Your mammogram has been scheduled for Wed Nov 23rd at [facility] ([address])   Please return for fasting labs at your convenience  The  diet I discussed with you today is the 10 day Green Smoothie Cleansing /Detox Diet by Linden Dolin . available at [employer] or on [address] for around $10.  This is a low fat diet that elminates sugar, gluten, caffeine, alcohol and dairy products  for 10 days .  What you add back after the initial ten days is entirely up to  you!  You can expect to lose 5 to 10 lbs depending on how strict you are and how many meals your substitute .   I suggest  Substituting 2 meals daily and keeping one chewable meal (but keep it simple, like baked fish and salad, rice or bok choy) kept me satisfied and kept me from straying  .  You snack primarily on fresh  fruit, egg whites and judicious quantities of nuts. Tasia Catchings can add a  vegetable based protein powder  To any smoothie made with almond milk.  (do not use protein powders made from whey , since whey is dairy) .  [employer] has a Research officer, political party .   Your will need some form of a nutrient extractor (Vita Mix, a electric juicer,  Or at minimum a  Nutribullet Rx).  Using frozen fruits is much more convenient and cost effective. Use organic fruit when possible.  Just thaw by steeping your fruit in  hot water for a few minutes  (to avoid jamming up your machine)   The organic greens drink I use if I don't have any fresh greens  is called "Suja" and it's sold in the vegetable refrigerated section of most grocery stores (including [employer])  . It is tart, though, so be careful (has lemon juice in it )  The organic vegan protein powder I tried  is called Vega" and I found it at [employer] .  It is sugar free  Health Maintenance, Female Adopting a healthy lifestyle and getting preventive care can go a long way to promote health and wellness. Talk with your health care provider about what schedule of regular examinations is right  for you. This is a good chance for you to check in with your provider about disease prevention and staying healthy. In between checkups, there are plenty of things you can do on your own. Experts have done a lot of research about which lifestyle changes and preventive measures are most likely to keep you healthy. Ask your health care provider for more information. WEIGHT AND DIET  Eat a healthy diet  Be sure to include plenty of vegetables, fruits, low-fat dairy products, and lean protein.  Do not eat a lot of foods high in solid fats, added sugars, or salt.  Get regular exercise. This is one of the most important things you can do for your health.  Most adults should exercise for at least 150 minutes each week. The exercise should increase your heart rate and make you sweat (moderate-intensity exercise).  Most adults should also do strengthening exercises at least twice a week. This is in addition to the moderate-intensity exercise.  Maintain a healthy weight  Body mass index (BMI) is a measurement that can be used to identify possible weight problems. It estimates body fat based on height and weight. Your health care provider can help determine your BMI and help you achieve or maintain a healthy  weight.  For females 41 years of age and older:   A BMI below 18.5 is considered underweight.  A BMI of 18.5 to 24.9 is normal.  A BMI of 25 to 29.9 is considered overweight.  A BMI of 30 and above is considered obese.  Watch levels of cholesterol and blood lipids  You should start having your blood tested for lipids and cholesterol at 40 years of age, then have this test every 5 years.  You may need to have your cholesterol levels checked more often if:  Your lipid or cholesterol levels are high.  You are older than 40 years of age.  You are at high risk for heart disease.  CANCER SCREENING   Lung Cancer  Lung cancer screening is recommended for adults 85-58 years old who are  at high risk for lung cancer because of a history of smoking.  A yearly low-dose CT scan of the lungs is recommended for people who:  Currently smoke.  Have quit within the past 15 years.  Have at least a 30-pack-year history of smoking. A pack year is smoking an average of one pack of cigarettes a day for 1 year.  Yearly screening should continue until it has been 15 years since you quit.  Yearly screening should stop if you develop a health problem that would prevent you from having lung cancer treatment.  Breast Cancer  Practice breast self-awareness. This means understanding how your breasts normally appear and feel.  It also means doing regular breast self-exams. Let your health care provider know about any changes, no matter how small.  If you are in your 20s or 30s, you should have a clinical breast exam (CBE) by a health care provider every 1-3 years as part of a regular health exam.  If you are 14 or older, have a CBE every year. Also consider having a breast X-ray (mammogram) every year.  If you have a family history of breast cancer, talk to your health care provider about genetic screening.  If you are at high risk for breast cancer, talk to your health care provider about having an MRI and a mammogram every year.  Breast cancer gene (BRCA) assessment is recommended for women who have family members with BRCA-related cancers. BRCA-related cancers include:  Breast.  Ovarian.  Tubal.  Peritoneal cancers.  Results of the assessment will determine the need for genetic counseling and BRCA1 and BRCA2 testing. Cervical Cancer Your health care provider may recommend that you be screened regularly for cancer of the pelvic organs (ovaries, uterus, and vagina). This screening involves a pelvic examination, including checking for microscopic changes to the surface of your cervix (Pap test). You may be encouraged to have this screening done every 3 years, beginning at age  68.  For women ages 73-65, health care providers may recommend pelvic exams and Pap testing every 3 years, or they may recommend the Pap and pelvic exam, combined with testing for human papilloma virus (HPV), every 5 years. Some types of HPV increase your risk of cervical cancer. Testing for HPV may also be done on women of any age with unclear Pap test results.  Other health care providers may not recommend any screening for nonpregnant women who are considered low risk for pelvic cancer and who do not have symptoms. Ask your health care provider if a screening pelvic exam is right for you.  If you have had past treatment for cervical cancer or a condition that could lead to  cancer, you need Pap tests and screening for cancer for at least 20 years after your treatment. If Pap tests have been discontinued, your risk factors (such as having a new sexual partner) need to be reassessed to determine if screening should resume. Some women have medical problems that increase the chance of getting cervical cancer. In these cases, your health care provider may recommend more frequent screening and Pap tests. Colorectal Cancer  This type of cancer can be detected and often prevented.  Routine colorectal cancer screening usually begins at 40 years of age and continues through 40 years of age.  Your health care provider may recommend screening at an earlier age if you have risk factors for colon cancer.  Your health care provider may also recommend using home test kits to check for hidden blood in the stool.  A small camera at the end of a tube can be used to examine your colon directly (sigmoidoscopy or colonoscopy). This is done to check for the earliest forms of colorectal cancer.  Routine screening usually begins at age 81.  Direct examination of the colon should be repeated every 5-10 years through 40 years of age. However, you may need to be screened more often if early forms of precancerous polyps  or small growths are found. Skin Cancer  Check your skin from head to toe regularly.  Tell your health care provider about any new moles or changes in moles, especially if there is a change in a mole's shape or color.  Also tell your health care provider if you have a mole that is larger than the size of a pencil eraser.  Always use sunscreen. Apply sunscreen liberally and repeatedly throughout the day.  Protect yourself by wearing long sleeves, pants, a wide-brimmed hat, and sunglasses whenever you are outside. HEART DISEASE, DIABETES, AND HIGH BLOOD PRESSURE   High blood pressure causes heart disease and increases the risk of stroke. High blood pressure is more likely to develop in:  People who have blood pressure in the high end of the normal range (130-139/85-89 mm Hg).  People who are overweight or obese.  People who are African American.  If you are 55-37 years of age, have your blood pressure checked every 3-5 years. If you are 98 years of age or older, have your blood pressure checked every year. You should have your blood pressure measured twice--once when you are at a hospital or clinic, and once when you are not at a hospital or clinic. Record the average of the two measurements. To check your blood pressure when you are not at a hospital or clinic, you can use:  An automated blood pressure machine at a pharmacy.  A home blood pressure monitor.  If you are between 41 years and 67 years old, ask your health care provider if you should take aspirin to prevent strokes.  Have regular diabetes screenings. This involves taking a blood sample to check your fasting blood sugar level.  If you are at a normal weight and have a low risk for diabetes, have this test once every three years after 40 years of age.  If you are overweight and have a high risk for diabetes, consider being tested at a younger age or more often. PREVENTING INFECTION  Hepatitis B  If you have a higher  risk for hepatitis B, you should be screened for this virus. You are considered at high risk for hepatitis B if:  You were born in a country where  hepatitis B is common. Ask your health care provider which countries are considered high risk.  Your parents were born in a high-risk country, and you have not been immunized against hepatitis B (hepatitis B vaccine).  You have HIV or AIDS.  You use needles to inject street drugs.  You live with someone who has hepatitis B.  You have had sex with someone who has hepatitis B.  You get hemodialysis treatment.  You take certain medicines for conditions, including cancer, organ transplantation, and autoimmune conditions. Hepatitis C  Blood testing is recommended for:  Everyone born from [birth year] through 1966.  Anyone with known risk factors for hepatitis C. Sexually transmitted infections (STIs)  You should be screened for sexually transmitted infections (STIs) including gonorrhea and chlamydia if:  You are sexually active and are younger than 40 years of age.  You are older than 40 years of age and your health care provider tells you that you are at risk for this type of infection.  Your sexual activity has changed since you were last screened and you are at an increased risk for chlamydia or gonorrhea. Ask your health care provider if you are at risk.  If you do not have HIV, but are at risk, it may be recommended that you take a prescription medicine daily to prevent HIV infection. This is called pre-exposure prophylaxis (PrEP). You are considered at risk if:  You are sexually active and do not regularly use condoms or know the HIV status of your partner(s).  You take drugs by injection.  You are sexually active with a partner who has HIV. Talk with your health care provider about whether you are at high risk of being infected with HIV. If you choose to begin PrEP, you should first be tested for HIV. You should then be tested every 3  months for as long as you are taking PrEP.  PREGNANCY   If you are premenopausal and you may become pregnant, ask your health care provider about preconception counseling.  If you may become pregnant, take 400 to 800 micrograms (mcg) of folic acid every day.  If you want to prevent pregnancy, talk to your health care provider about birth control (contraception). OSTEOPOROSIS AND MENOPAUSE   Osteoporosis is a disease in which the bones lose minerals and strength with aging. This can result in serious bone fractures. Your risk for osteoporosis can be identified using a bone density scan.  If you are 7 years of age or older, or if you are at risk for osteoporosis and fractures, ask your health care provider if you should be screened.  Ask your health care provider whether you should take a calcium or vitamin D supplement to lower your risk for osteoporosis.  Menopause may have certain physical symptoms and risks.  Hormone replacement therapy may reduce some of these symptoms and risks. Talk to your health care provider about whether hormone replacement therapy is right for you.  HOME CARE INSTRUCTIONS   Schedule regular health, dental, and eye exams.  Stay current with your immunizations.   Do not use any tobacco products including cigarettes, chewing tobacco, or electronic cigarettes.  If you are pregnant, do not drink alcohol.  If you are breastfeeding, limit how much and how often you drink alcohol.  Limit alcohol intake to no more than 1 drink per day for nonpregnant women. One drink equals 12 ounces of beer, 5 ounces of wine, or 1 ounces of hard liquor.  Do not use street  drugs.  Do not share needles.  Ask your health care provider for help if you need support or information about quitting drugs.  Tell your health care provider if you often feel depressed.  Tell your health care provider if you have ever been abused or do not feel safe at home.   This information is  not intended to replace advice given to you by your health care provider. Make sure you discuss any questions you have with your health care provider.   Document Released: 03/21/2011 Document Revised: 09/26/2014 Document Reviewed: 08/07/2013 Elsevier Interactive Patient Education [employer].

## 2015-07-29 NOTE — Progress Notes (Signed)
Patient ID: Nancy Leon, female    DOB: 1975/10/17  Age: 39 y.o. MRN: [id number]  The patient is here for annual comprehensive examination .   She is due for PAP smear and mammogram.   The risk factors are reflected in the social history.  The roster of all physicians providing medical care to patient - is listed in the Snapshot section of the chart.  Home safety : The patient has smoke detectors in the home. They wear seatbelts.  There are no firearms at home. There is no violence in the home.   There is no risks for hepatitis, STDs or HIV. There is no   history of blood transfusion. They have no travel history to infectious disease endemic areas of the world.  The patient has seen their dentist in the last six month. They have seen their eye doctor in the last year. They admit to slight hearing difficulty with regard to whispered voices and some television programs.  They have deferred audiologic testing in the last year.  They do not  have excessive sun exposure. Discussed the need for sun protection: hats, long sleeves and use of sunscreen if there is significant sun exposure.   Diet: the importance of a healthy diet is discussed. They do have a healthy diet.  The benefits of regular aerobic exercise were discussed. She does mot exercise  Regularly .  Depression screen: there are no signs or vegative symptoms of depression- irritability, change in appetite, anhedonia, sadness/tearfullness.   The following portions of the patient's history were reviewed and updated as appropriate: allergies, current medications, past family history, past medical history,  past surgical history, past social history  and problem list.  Visual acuity was not assessed per patient preference since she has regular follow up with her ophthalmologist. Hearing and body mass index were assessed and reviewed.   During the course of the visit the patient was educated and counseled about appropriate  screening and preventive services including : fall prevention , diabetes screening, nutrition counseling, colorectal cancer screening, and recommended immunizations.    CC: The primary encounter diagnosis was Screening breast examination. Diagnoses of Pap smear for cervical cancer screening, Essential hypertension, benign, Obesity (BMI 30.0-34.9), Hyperlipidemia, Screen for STD (sexually transmitted disease), Encounter for preventive health examination, and Cervical high risk human papillomavirus (HPV) DNA test positive were also pertinent to this visit.  History Judi has a past medical history of Allergy; Depression; Premenstrual dysphoric disorder; and Hypertension.   She has past surgical history that includes Tubal ligation and Cesarean section (2011).   Her family history includes Asthma in her father; Cancer (age of onset: 27) in her mother; Diabetes in her mother; Hyperlipidemia in her mother; Hypertension in her father.She reports that she has never smoked. She does not have any smokeless tobacco history on file. She reports that she drinks alcohol. She reports that she does not use illicit drugs.  Outpatient Prescriptions Prior to Visit  Medication Sig Dispense Refill  . fluticasone (FLONASE) 50 MCG/ACT nasal spray Place 2 sprays into both nostrils daily. 16 g 2  . loratadine (CLARITIN) 10 MG tablet Take 10 mg by mouth daily.    . hydrochlorothiazide (HYDRODIURIL) 25 MG tablet TAKE 1 TABLET BY MOUTH EVERY DAY 30 tablet 0  . amoxicillin-clavulanate (AUGMENTIN) 875-125 MG per tablet Take 1 tablet by mouth every 12 (twelve) hours. 20 tablet 0   No facility-administered medications prior to visit.    Review of Systems   Patient  denies headache, fevers, malaise, unintentional weight loss, skin rash, eye pain, sinus congestion and sinus pain, sore throat, dysphagia,  hemoptysis , cough, dyspnea, wheezing, chest pain, palpitations, orthopnea, edema, abdominal pain, nausea, melena,  diarrhea, constipation, flank pain, dysuria, hematuria, urinary  Frequency, nocturia, numbness, tingling, seizures,  Focal weakness, Loss of consciousness,  Tremor, insomnia, depression, anxiety, and suicidal ideation.      Objective:  BP 110/80 mmHg  Pulse 83  Temp(Src) 98.5 F (36.9 C) (Oral)  Resp 18  Ht 5\' 4"  (1.626 m)  Wt 194 lb 8 oz (88.225 kg)  BMI 33.37 kg/m2  SpO2 97%  LMP 07/10/2015 (Exact Date)  Physical Exam   General Appearance:    Alert, cooperative, no distress, appears stated age  Head:    Normocephalic, without obvious abnormality, atraumatic  Eyes:    PERRL, conjunctiva/corneas clear, EOM's intact, fundi    benign, both eyes  Ears:    Normal TM's and external ear canals, both ears  Nose:   Nares normal, septum midline, mucosa normal, no drainage    or sinus tenderness  Throat:   Lips, mucosa, and tongue normal; teeth and gums normal  Neck:   Supple, symmetrical, trachea midline, no adenopathy;    thyroid:  no enlargement/tenderness/nodules; no carotid   bruit or JVD  Back:     Symmetric, no curvature, ROM normal, no CVA tenderness  Lungs:     Clear to auscultation bilaterally, respirations unlabored  Chest Wall:    No tenderness or deformity   Heart:    Regular rate and rhythm, S1 and S2 normal, no murmur, rub   or gallop  Breast Exam:    No tenderness, masses, or nipple abnormality  Abdomen:     Soft, non-tender, bowel sounds active all four quadrants,    no masses, no organomegaly  Genitalia:    Pelvic: cervix normal in appearance, external genitalia normal, no adnexal masses or tenderness, no cervical motion tenderness, rectovaginal septum normal, uterus normal size, shape, and consistency and vagina normal without discharge  Extremities:   Extremities normal, atraumatic, no cyanosis or edema  Pulses:   2+ and symmetric all extremities  Skin:   Skin color, texture, turgor normal, no rashes or lesions  Lymph nodes:   Cervical, supraclavicular, and axillary  nodes normal  Neurologic:   CNII-XII intact, normal strength, sensation and reflexes    throughout    Assessment & Plan:   Problem List Items Addressed This Visit    Cervical high risk human papillomavirus (HPV) DNA test positive    Repeat PAP was normal  Today.  HPV was not done         Obesity (BMI 30.0-34.9)    Reviewed her previous success at weight loss,  Her goal weight for BMI < 30 (210 lbs). I have addressed  BMI and recommended wt loss of 10% of body weight over the next 6 months using a low fat diet and regular exercise a minimum of 5 days per week.        Relevant Orders   TSH   Essential hypertension, benign    Well controlled on current regimen. Renal function due, no changes today.  Lab Results  Component Value Date   CREATININE 0.79 11/13/2014   Lab Results  Component Value Date   NA 138 11/13/2014   K 4.6 11/13/2014   CL 101 11/13/2014   CO2 31 11/13/2014         Relevant Medications   hydrochlorothiazide (HYDRODIURIL) 25  MG tablet   Other Relevant Orders   Comprehensive metabolic panel   Encounter for preventive health examination    Annual wellness  exam was done as well as a comprehensive physical exam  .  During the course of the visit the patient was educated and counseled about appropriate screening and preventive services and screenings were brought up to date for cervical and breast cancer .  She will return for fasting labs to provide samples for diabetes screening and lipid analysis with projected  10 year  risk for CAD. nutrition counseling, skin cancer screening has been recommended, along with review of the age appropriate recommended immunizations.  Printed recommendations for health maintenance screenings was given.         Other Visit Diagnoses    Screening breast examination    -  Primary    Relevant Orders    MM DIGITAL SCREENING BILATERAL    Pap smear for cervical cancer screening        Relevant Orders    Cytology - PAP  (Completed)    Hyperlipidemia        Relevant Medications    hydrochlorothiazide (HYDRODIURIL) 25 MG tablet    Other Relevant Orders    Lipid panel    LDL cholesterol, direct    Screen for STD (sexually transmitted disease)        Relevant Orders    Hepatitis C antibody       I have discontinued Ms. Hemmelgarn's amoxicillin-clavulanate. I have also changed her hydrochlorothiazide. Additionally, I am having her maintain her loratadine and fluticasone.  Meds ordered this encounter  Medications  . hydrochlorothiazide (HYDRODIURIL) 25 MG tablet    Sig: Take 1 tablet (25 mg total) by mouth daily.    Dispense:  30 tablet    Refill:  11    Medications Discontinued During This Encounter  Medication Reason  . amoxicillin-clavulanate (AUGMENTIN) 875-125 MG per tablet Completed Course  . hydrochlorothiazide (HYDRODIURIL) 25 MG tablet Reorder    Follow-up: No Follow-up on file.   Crecencio Mc, MD

## 2015-07-31 LAB — CYTOLOGY - PAP

## 2015-08-01 DIAGNOSIS — Z0000 Encounter for general adult medical examination without abnormal findings: Secondary | ICD-10-CM | POA: Insufficient documentation

## 2015-08-01 NOTE — Assessment & Plan Note (Addendum)
Repeat PAP was normal  Today.  HPV was not done

## 2015-08-01 NOTE — Assessment & Plan Note (Signed)

## 2015-08-01 NOTE — Assessment & Plan Note (Signed)
Well controlled on current regimen. Renal function due, no changes today.  Lab Results  Component Value Date   CREATININE 0.79 11/13/2014   Lab Results  Component Value Date   NA 138 11/13/2014   K 4.6 11/13/2014   CL 101 11/13/2014   CO2 31 11/13/2014

## 2015-08-01 NOTE — Assessment & Plan Note (Signed)
Reviewed her previous success at weight loss,  Her goal weight for BMI < 30 (210 lbs). I have addressed  BMI and recommended wt loss of 10% of body weight over the next 6 months using a low fat diet and regular exercise a minimum of 5 days per week.

## 2015-08-02 ENCOUNTER — Encounter: Payer: Self-pay | Admitting: Internal Medicine

## 2015-08-05 ENCOUNTER — Other Ambulatory Visit (INDEPENDENT_AMBULATORY_CARE_PROVIDER_SITE_OTHER): Payer: 59

## 2015-08-05 DIAGNOSIS — E669 Obesity, unspecified: Secondary | ICD-10-CM | POA: Diagnosis not present

## 2015-08-05 DIAGNOSIS — E785 Hyperlipidemia, unspecified: Secondary | ICD-10-CM

## 2015-08-05 DIAGNOSIS — I10 Essential (primary) hypertension: Secondary | ICD-10-CM | POA: Diagnosis not present

## 2015-08-05 DIAGNOSIS — Z113 Encounter for screening for infections with a predominantly sexual mode of transmission: Secondary | ICD-10-CM

## 2015-08-05 LAB — COMPREHENSIVE METABOLIC PANEL
ALT: 17 U/L (ref 0–35)
AST: 16 U/L (ref 0–37)
Albumin: 4.5 g/dL (ref 3.5–5.2)
Alkaline Phosphatase: 47 U/L (ref 39–117)
BILIRUBIN TOTAL: 0.5 mg/dL (ref 0.2–1.2)
BUN: 10 mg/dL (ref 6–23)
CO2: 29 meq/L (ref 19–32)
Calcium: 9.8 mg/dL (ref 8.4–10.5)
Chloride: 102 mEq/L (ref 96–112)
Creatinine, Ser: 0.79 mg/dL (ref 0.40–1.20)
GFR: 85.45 mL/min (ref >60.00)
GLUCOSE: 89 mg/dL (ref 70–99)
POTASSIUM: 4.4 meq/L (ref 3.5–5.1)
SODIUM: 137 meq/L (ref 135–145)
TOTAL PROTEIN: 7.9 g/dL (ref 6.0–8.3)

## 2015-08-05 LAB — LIPID PANEL
CHOL/HDL RATIO: 3
Cholesterol: 173 mg/dL (ref 0–200)
HDL: 51.8 mg/dL (ref >39.00)
LDL CALC: 102 mg/dL — AB (ref 0–99)
NONHDL: 121.38
TRIGLYCERIDES: 99 mg/dL (ref 0.0–149.0)
VLDL: 19.8 mg/dL (ref 0.0–40.0)

## 2015-08-05 LAB — HEPATITIS C ANTIBODY: HCV Ab: NEGATIVE

## 2015-08-05 LAB — LDL CHOLESTEROL, DIRECT: Direct LDL: 109 mg/dL

## 2015-08-05 LAB — TSH: TSH: 1.32 u[IU]/mL (ref 0.35–4.50)

## 2015-08-06 ENCOUNTER — Encounter: Payer: Self-pay | Admitting: Internal Medicine

## 2015-08-12 ENCOUNTER — Ambulatory Visit
Admission: RE | Admit: 2015-08-12 | Discharge: 2015-08-12 | Disposition: A | Discharge status: Home / Self Care | Payer: 59 | Source: Ambulatory Visit | Attending: Internal Medicine | Admitting: Internal Medicine

## 2015-08-12 DIAGNOSIS — Z1239 Encounter for other screening for malignant neoplasm of breast: Secondary | ICD-10-CM

## 2015-08-12 DIAGNOSIS — Z1231 Encounter for screening mammogram for malignant neoplasm of breast: Secondary | ICD-10-CM | POA: Insufficient documentation

## 2015-08-14 ENCOUNTER — Encounter: Payer: Self-pay | Admitting: Internal Medicine

## 2015-08-17 ENCOUNTER — Other Ambulatory Visit: Payer: Self-pay | Admitting: Internal Medicine

## 2015-08-17 DIAGNOSIS — N6320 Unspecified lump in the left breast, unspecified quadrant: Secondary | ICD-10-CM

## 2015-08-17 DIAGNOSIS — R928 Other abnormal and inconclusive findings on diagnostic imaging of breast: Secondary | ICD-10-CM

## 2015-08-24 ENCOUNTER — Ambulatory Visit
Admission: RE | Admit: 2015-08-24 | Discharge: 2015-08-24 | Disposition: A | Discharge status: Home / Self Care | Payer: 59 | Source: Ambulatory Visit | Attending: Internal Medicine | Admitting: Internal Medicine

## 2015-08-24 DIAGNOSIS — R928 Other abnormal and inconclusive findings on diagnostic imaging of breast: Secondary | ICD-10-CM

## 2015-08-24 DIAGNOSIS — N6320 Unspecified lump in the left breast, unspecified quadrant: Secondary | ICD-10-CM

## 2015-08-24 DIAGNOSIS — N63 Unspecified lump in breast: Secondary | ICD-10-CM | POA: Diagnosis present

## 2015-08-24 DIAGNOSIS — N6002 Solitary cyst of left breast: Secondary | ICD-10-CM | POA: Diagnosis not present

## 2015-10-08 ENCOUNTER — Telehealth: Payer: Self-pay | Admitting: *Deleted

## 2015-10-20 NOTE — Telephone Encounter (Signed)
NO ENTRY 

## 2015-12-01 ENCOUNTER — Ambulatory Visit: Payer: 59 | Admitting: Family Medicine

## 2015-12-02 ENCOUNTER — Ambulatory Visit: Payer: 59 | Admitting: Family Medicine

## 2015-12-04 ENCOUNTER — Encounter: Payer: Self-pay | Admitting: Emergency Medicine

## 2015-12-04 ENCOUNTER — Ambulatory Visit
Admission: EM | Admit: 2015-12-04 | Discharge: 2015-12-04 | Disposition: A | Discharge status: Home / Self Care | Payer: 59 | Attending: Family Medicine | Admitting: Family Medicine

## 2015-12-04 DIAGNOSIS — R300 Dysuria: Secondary | ICD-10-CM

## 2015-12-04 MED ORDER — CIPROFLOXACIN HCL 500 MG PO TABS: 500.0000 mg | ORAL_TABLET | Freq: Two times a day (BID) | ORAL | Status: DC

## 2015-12-04 NOTE — ED Notes (Signed)
Patient states she felt like she was developing a UTI a month ago just before starting her period but symptoms went away.  This month the same thing has happened but the symptoms have not gone away. Patient states she has Burning and some pain with urination.

## 2015-12-04 NOTE — ED Provider Notes (Signed)
CSN: [id number]     Arrival date & time 12/04/15  [id number] History   First MD Initiated Contact with Patient 12/04/15 [phone]     Chief Complaint  Patient presents with  . Dysuria   (Consider location/radiation/quality/duration/timing/severity/associated sxs/prior Treatment) Patient is a 40 y.o. female presenting with dysuria. The history is provided by the patient.  Dysuria Pain quality:  Burning Pain severity:  Mild Onset quality:  Sudden Duration:  3 days Timing:  Constant Progression:  Worsening Chronicity:  New Recent urinary tract infections: no   Relieved by:  None tried Urinary symptoms: frequent urination   Urinary symptoms: no discolored urine, no foul-smelling urine, no hematuria, no hesitancy and no bladder incontinence   Associated symptoms: no abdominal pain, no fever, no flank pain, no genital lesions, no nausea, no vaginal discharge and no vomiting   Risk factors: no hx of pyelonephritis, no hx of urolithiasis, no kidney transplant, not pregnant, no recurrent urinary tract infections, no renal cysts, no renal disease, not sexually active, not single kidney, no sexually transmitted infections and no urinary catheter     Past Medical History  Diagnosis Date  . Allergy   . Depression   . Premenstrual dysphoric disorder   . Hypertension    Past Surgical History  Procedure Laterality Date  . Tubal ligation    . Cesarean section  2011   Family History  Problem Relation Age of Onset  . Diabetes Mother   . Hyperlipidemia Mother   . Cancer Mother 44    colon ca  . Asthma Father   . Hypertension Father    Social History  Substance Use Topics  . Smoking status: Never Smoker   . Smokeless tobacco: None  . Alcohol Use: 0.0 oz/week    0 Standard drinks or equivalent per week   OB History    No data available     Review of Systems  Constitutional: Negative for fever.  Gastrointestinal: Negative for nausea, vomiting and abdominal pain.  Genitourinary: Positive for  dysuria. Negative for flank pain and vaginal discharge.    Allergies  Review of patient's allergies indicates no known allergies.  Home Medications   Prior to Admission medications   Medication Sig Start Date End Date Taking? Authorizing Provider  fluticasone (FLONASE) 50 MCG/ACT nasal spray Place 2 sprays into both nostrils daily. 02/15/15  Yes Jan Fireman, PA-C  hydrochlorothiazide (HYDRODIURIL) 25 MG tablet Take 1 tablet (25 mg total) by mouth daily. 07/29/15  Yes Crecencio Mc, MD  loratadine (CLARITIN) 10 MG tablet Take 10 mg by mouth daily.   Yes Historical Provider, MD  ciprofloxacin (CIPRO) 500 MG tablet Take 1 tablet (500 mg total) by mouth every 12 (twelve) hours. 12/04/15   Norval Gable, MD   Meds Ordered and Administered this Visit  Medications - No data to display  BP 134/84 mmHg  Pulse 104  Temp(Src) 98.2 F (36.8 C) (Tympanic)  Resp 18  Ht 5\' 3"  (1.6 m)  Wt 190 lb (86.183 kg)  BMI 33.67 kg/m2  SpO2 100% No data found.   Physical Exam  Constitutional: She appears well-developed and well-nourished. No distress.  Abdominal: Soft. Bowel sounds are normal. She exhibits no distension and no mass. There is tenderness (mild suprapubic). There is no rebound and no guarding.  Skin: She is not diaphoretic.  Nursing note and vitals reviewed.   ED Course  Procedures (including critical care time)  Labs Review Labs Reviewed  URINE CULTURE  URINALYSIS COMPLETEWITH MICROSCOPIC [facility]  ONLY)    Imaging Review No results found.   Visual Acuity Review  Right Eye Distance:   Left Eye Distance:   Bilateral Distance:    Right Eye Near:   Left Eye Near:    Bilateral Near:         MDM   1. Dysuria   (likely secondary to UTI)    Discharge Medication List as of 12/04/2015  9:39 AM    START taking these medications   Details  ciprofloxacin (CIPRO) 500 MG tablet Take 1 tablet (500 mg total) by mouth every 12 (twelve) hours., Starting 12/04/2015, Until  Discontinued, Normal       1. Lab results and diagnosis reviewed with patient 2. rx as per orders above; reviewed possible side effects, interactions, risks and benefits  3. Recommend supportive treatment with increased water intake 4. Check urine culture 5.  Follow-up prn if symptoms worsen or don't improve    Norval Gable, MD 12/04/15 [phone]

## 2015-12-06 LAB — URINE CULTURE

## 2015-12-17 ENCOUNTER — Ambulatory Visit: Payer: 59 | Admitting: Family Medicine

## 2015-12-18 ENCOUNTER — Encounter: Payer: Self-pay | Admitting: Internal Medicine

## 2015-12-18 ENCOUNTER — Ambulatory Visit: Payer: 59 | Admitting: Family Medicine

## 2015-12-18 ENCOUNTER — Ambulatory Visit (INDEPENDENT_AMBULATORY_CARE_PROVIDER_SITE_OTHER): Payer: 59 | Admitting: Internal Medicine

## 2015-12-18 VITALS — BP 138/89 | HR 118 | Temp 98.8°F | Wt 203.2 lb

## 2015-12-18 DIAGNOSIS — B3731 Acute candidiasis of vulva and vagina: Secondary | ICD-10-CM

## 2015-12-18 DIAGNOSIS — B373 Candidiasis of vulva and vagina: Secondary | ICD-10-CM | POA: Diagnosis not present

## 2015-12-18 DIAGNOSIS — N3000 Acute cystitis without hematuria: Secondary | ICD-10-CM | POA: Diagnosis not present

## 2015-12-18 DIAGNOSIS — R350 Frequency of micturition: Secondary | ICD-10-CM

## 2015-12-18 DIAGNOSIS — N3090 Cystitis, unspecified without hematuria: Secondary | ICD-10-CM | POA: Insufficient documentation

## 2015-12-18 LAB — POCT URINALYSIS DIPSTICK
BILIRUBIN UA: NEGATIVE
GLUCOSE UA: NEGATIVE
Ketones, UA: NEGATIVE
Leukocytes, UA: NEGATIVE
Nitrite, UA: NEGATIVE
Protein, UA: NEGATIVE
RBC UA: NEGATIVE
Urobilinogen, UA: 0.2
pH, UA: 6

## 2015-12-18 MED ORDER — FLUCONAZOLE 150 MG PO TABS: 150.0000 mg | ORAL_TABLET | ORAL | Status: DC

## 2015-12-18 MED ORDER — CIPROFLOXACIN HCL 500 MG PO TABS: 500.0000 mg | ORAL_TABLET | Freq: Two times a day (BID) | ORAL | Status: DC

## 2015-12-18 NOTE — Assessment & Plan Note (Signed)
Symptoms are c/w recurrent UTI. UA today neg. Will send urine for repeat culture. Suspect inadequate treatment of initial Klebsiella UTI. Start Cipro 500mg  po bid x 7 days. Azo prn. Increase fluids. Follow up by email Sunday and prn.

## 2015-12-18 NOTE — Assessment & Plan Note (Signed)
Symptoms c/w candidal vaginitis. Start Diflucan.

## 2015-12-18 NOTE — Patient Instructions (Signed)
Email with update Sunday.  Start Cipro twice daily x 1 week.  Start Diflucan weekly x 2.

## 2015-12-18 NOTE — Progress Notes (Signed)
Pre visit review using our clinic review tool, if applicable. No additional management support is needed unless otherwise documented below in the visit note. 

## 2015-12-18 NOTE — Progress Notes (Signed)
Subjective:    Patient ID: Nancy Leon, female    DOB: 12-22-1975, 40 y.o.   MRN: [id number]  HPI  40YO female presents for acute visit.  UTI - Recently treated with 5 days of Cipro for UTI. Completed course, felt better right at end of course of antibiotics. Yesterday, developed recurrent pressure, urgency, frequency. No hematuria. No fever. Developed yeast infection Sunday with vaginal itching and white discharge. Used Monistat with some improvement.  Wt Readings from Last 3 Encounters:  12/18/15 203 lb 4 oz (92.194 kg)  12/04/15 190 lb (86.183 kg)  07/29/15 194 lb 8 oz (88.225 kg)   BP Readings from Last 3 Encounters:  12/18/15 138/89  12/04/15 134/84  07/29/15 110/80    Past Medical History  Diagnosis Date  . Allergy   . Depression   . Premenstrual dysphoric disorder   . Hypertension    Family History  Problem Relation Age of Onset  . Diabetes Mother   . Hyperlipidemia Mother   . Cancer Mother 36    colon ca  . Asthma Father   . Hypertension Father    Past Surgical History  Procedure Laterality Date  . Tubal ligation    . Cesarean section  2011   Social History   Social History  . Marital Status: Married    Spouse Name: N/A  . Number of Children: N/A  . Years of Education: N/A   Social History Main Topics  . Smoking status: Never Smoker   . Smokeless tobacco: None  . Alcohol Use: 0.0 oz/week    0 Standard drinks or equivalent per week  . Drug Use: No  . Sexual Activity: Not Asked   Other Topics Concern  . None   Social History Narrative    Review of Systems  Constitutional: Negative for fever, chills and fatigue.  Gastrointestinal: Negative for nausea, vomiting, abdominal pain, diarrhea, constipation and rectal pain.  Genitourinary: Positive for dysuria, urgency, frequency and vaginal discharge. Negative for hematuria, flank pain, decreased urine volume, vaginal bleeding, difficulty urinating, vaginal pain and pelvic pain.       Objective:    BP 138/89 mmHg  Pulse 118  Temp(Src) 98.8 F (37.1 C) (Oral)  Wt 203 lb 4 oz (92.194 kg)  SpO2 99% Physical Exam  Constitutional: She is oriented to person, place, and time. She appears well-developed and well-nourished. No distress.  HENT:  Head: Normocephalic and atraumatic.  Right Ear: External ear normal.  Left Ear: External ear normal.  Nose: Nose normal.  Mouth/Throat: Oropharynx is clear and moist.  Eyes: Conjunctivae are normal. Pupils are equal, round, and reactive to light. Right eye exhibits no discharge. Left eye exhibits no discharge. No scleral icterus.  Neck: Normal range of motion. Neck supple. No tracheal deviation present. No thyromegaly present.  Cardiovascular: Normal rate, regular rhythm, normal heart sounds and intact distal pulses.  Exam reveals no gallop and no friction rub.   No murmur heard. Pulmonary/Chest: Effort normal and breath sounds normal. No respiratory distress. She has no wheezes. She has no rales. She exhibits no tenderness.  Abdominal: There is no tenderness (no CVS tenderness).  Musculoskeletal: Normal range of motion. She exhibits no edema or tenderness.  Lymphadenopathy:    She has no cervical adenopathy.  Neurological: She is alert and oriented to person, place, and time. No cranial nerve deficit. She exhibits normal muscle tone. Coordination normal.  Skin: Skin is warm and dry. No rash noted. She is not diaphoretic. No erythema. No  pallor.  Psychiatric: She has a normal mood and affect. Her behavior is normal. Judgment and thought content normal.          Assessment & Plan:   Problem List Items Addressed This Visit      Unprioritized   Acute cystitis without hematuria - Primary    Symptoms are c/w recurrent UTI. UA today neg. Will send urine for repeat culture. Suspect inadequate treatment of initial Klebsiella UTI. Start Cipro 500mg  po bid x 7 days. Azo prn. Increase fluids. Follow up by email Sunday and prn.        Relevant Medications   fluconazole (DIFLUCAN) 150 MG tablet   ciprofloxacin (CIPRO) 500 MG tablet   Other Relevant Orders   Urine culture   Candida vaginitis    Symptoms c/w candidal vaginitis. Start Diflucan.       Relevant Medications   fluconazole (DIFLUCAN) 150 MG tablet   Other Relevant Orders   Urine culture    Other Visit Diagnoses    Urinary frequency        Relevant Orders    POCT Urinalysis Dipstick (Completed)    Urine culture        Return if symptoms worsen or fail to improve.  Ronette Deter, MD [facility]

## 2015-12-20 LAB — URINE CULTURE: Colony Count: 65000

## 2015-12-21 ENCOUNTER — Other Ambulatory Visit (INDEPENDENT_AMBULATORY_CARE_PROVIDER_SITE_OTHER): Payer: 59

## 2015-12-21 DIAGNOSIS — N390 Urinary tract infection, site not specified: Secondary | ICD-10-CM

## 2015-12-22 LAB — URINE CULTURE
Colony Count: NO GROWTH
Organism ID, Bacteria: NO GROWTH

## 2015-12-23 ENCOUNTER — Telehealth: Payer: Self-pay | Admitting: Internal Medicine

## 2015-12-23 NOTE — Telephone Encounter (Signed)
Urine culture neg

## 2016-01-04 ENCOUNTER — Encounter: Payer: Self-pay | Admitting: Internal Medicine

## 2016-01-05 ENCOUNTER — Other Ambulatory Visit: Payer: Self-pay | Admitting: Internal Medicine

## 2016-01-05 DIAGNOSIS — N3000 Acute cystitis without hematuria: Secondary | ICD-10-CM

## 2016-01-05 MED ORDER — FLUCONAZOLE 150 MG PO TABS: 150.0000 mg | ORAL_TABLET | Freq: Every day | ORAL | Status: DC

## 2016-07-30 ENCOUNTER — Other Ambulatory Visit: Payer: Self-pay | Admitting: Internal Medicine

## 2016-08-16 ENCOUNTER — Encounter: Payer: Self-pay | Admitting: Internal Medicine

## 2016-08-16 ENCOUNTER — Ambulatory Visit (INDEPENDENT_AMBULATORY_CARE_PROVIDER_SITE_OTHER): Payer: 59 | Admitting: Internal Medicine

## 2016-08-16 VITALS — BP 128/88 | HR 97 | Temp 98.2°F | Resp 12 | Ht 63.0 in | Wt 203.5 lb

## 2016-08-16 DIAGNOSIS — R253 Fasciculation: Secondary | ICD-10-CM | POA: Diagnosis not present

## 2016-08-16 DIAGNOSIS — I10 Essential (primary) hypertension: Secondary | ICD-10-CM

## 2016-08-16 DIAGNOSIS — R5383 Other fatigue: Secondary | ICD-10-CM

## 2016-08-16 DIAGNOSIS — E669 Obesity, unspecified: Secondary | ICD-10-CM | POA: Diagnosis not present

## 2016-08-16 MED ORDER — LOSARTAN POTASSIUM-HCTZ 50-12.5 MG PO TABS: 1.0000 | ORAL_TABLET | Freq: Every day | ORAL | 3 refills | Status: DC

## 2016-08-16 NOTE — Progress Notes (Signed)
Subjective:  Patient ID: Nancy Leon, female    DOB: 11-Feb-1976  Age: 40 y.o. MRN: [id number]  CC: The primary encounter diagnosis was Muscle twitching. Diagnoses of Fatigue, unspecified type, Obesity (BMI 30.0-34.9), Essential hypertension, benign, and Muscle twitch were also pertinent to this visit.  HPI Nancy Leon presents for FOLLOW UP ON HYPERTENSION   Due for 3 d mammogram and CPE  Cc: parasthesias.  Developed area of numbness  On  medial thigh,  right side that has persisted  for 6 months.  Then had a altered sensation involving her right upper lip that started 2 months ago and lasted a week,  Then right after that started having thumb twitching on the right hand, then the arms,  Then the legs.  Intermittent . Most often occurs  during the day .  Went on [employer] ,  thinks she might have MS.    Obesity with weight gain.  Not exercising.  BP elevated.   Only takes the hctz  Outpatient Medications Prior to Visit  Medication Sig Dispense Refill  . fluticasone (FLONASE) 50 MCG/ACT nasal spray Place 2 sprays into both nostrils daily. 16 g 2  . hydrochlorothiazide (HYDRODIURIL) 25 MG tablet TAKE 1 TABLET(25 MG) BY MOUTH DAILY 30 tablet 11  . loratadine (CLARITIN) 10 MG tablet Take 10 mg by mouth daily.    . ciprofloxacin (CIPRO) 500 MG tablet Take 1 tablet (500 mg total) by mouth 2 (two) times daily. (Patient not taking: Reported on 08/16/2016) 14 tablet 0  . fluconazole (DIFLUCAN) 150 MG tablet Take 1 tablet (150 mg total) by mouth daily. For 2 days (Patient not taking: Reported on 08/16/2016) 2 tablet 0   No facility-administered medications prior to visit.     Review of Systems;  Patient denies headache, fevers, malaise, unintentional weight loss, skin rash, eye pain, sinus congestion and sinus pain, sore throat, dysphagia,  hemoptysis , cough, dyspnea, wheezing, chest pain, palpitations, orthopnea, edema, abdominal pain, nausea, melena, diarrhea,  constipation, flank pain, dysuria, hematuria, urinary  Frequency, nocturia,  seizures,  Focal weakness, Loss of consciousness,  Tremor, insomnia, depression, anxiety, and suicidal ideation.      Objective:  BP 128/88   Pulse 97   Temp 98.2 F (36.8 C) (Oral)   Resp 12   Ht 5\' 3"  (1.6 m)   Wt 203 lb 8 oz (92.3 kg)   LMP 08/08/2016 (Exact Date)   SpO2 98%   BMI 36.05 kg/m   BP Readings from Last 3 Encounters:  08/16/16 128/88  12/18/15 138/89  12/04/15 134/84    Wt Readings from Last 3 Encounters:  08/16/16 203 lb 8 oz (92.3 kg)  12/18/15 203 lb 4 oz (92.2 kg)  12/04/15 190 lb (86.2 kg)    General appearance: alert, cooperative and appears stated age Neck: no adenopathy, no carotid bruit, supple, symmetrical, trachea midline and thyroid not enlarged, symmetric, no tenderness/mass/nodules Back: symmetric, no curvature. ROM normal. No CVA tenderness. Lungs: clear to auscultation bilaterally Heart: regular rate and rhythm, S1, S2 normal, no murmur, click, rub or gallop Abdomen: soft, non-tender; bowel sounds normal; no masses,  no organomegaly Pulses: 2+ and symmetric Skin: Skin color, texture, turgor normal. No rashes or lesions Lymph nodes: Cervical, supraclavicular, and axillary nodes normal. Neuro: CNs 2-12 intact. DTRs 2+/4 in biceps, brachioradialis, patellars and achilles. Muscle strength 5/5 in upper and lower exremities. Fine resting tremor bilaterally both hands cerebellar function normal. Romberg negative.  No pronator drift.   Gait  normal.   No results found for: HGBA1C  Lab Results  Component Value Date   CREATININE 0.84 08/16/2016   CREATININE 0.79 08/05/2015   CREATININE 0.79 11/13/2014    Lab Results  Component Value Date   WBC 8.8 08/16/2016   HGB 11.8 (L) 08/16/2016   HCT 35.5 (L) 08/16/2016   PLT 367.0 08/16/2016   GLUCOSE 106 (H) 08/16/2016   CHOL 173 08/05/2015   TRIG 99.0 08/05/2015   HDL 51.80 08/05/2015   LDLDIRECT 109.0 08/05/2015    LDLCALC 102 (H) 08/05/2015   ALT 17 08/05/2015   AST 16 08/05/2015   NA 139 08/16/2016   K 4.7 08/16/2016   CL 100 08/16/2016   CREATININE 0.84 08/16/2016   BUN 13 08/16/2016   CO2 30 08/16/2016   TSH 1.52 08/16/2016   MICROALBUR 0.8 04/24/2013    No results found.  Assessment & Plan:   Problem List Items Addressed This Visit    Essential hypertension, benign    Not well controlled on hctz alone.  Adding losartan . RTC one week for repeat BMET  Lab Results  Component Value Date   NA 139 08/16/2016   K 4.7 08/16/2016   CL 100 08/16/2016   CO2 30 08/16/2016   Lab Results  Component Value Date   CREATININE 0.84 08/16/2016         Relevant Medications   losartan-hydrochlorothiazide (HYZAAR) 50-12.5 MG tablet   Muscle twitch    Neurologic exam is normal and  Labs normal. Reassurance provided that her symptoms are very nonspecific and do not include dizziness., vision changes , or any other objective findings to support MRI brain and spine at this time.   Lab Results  Component Value Date   CKTOTAL 106 08/16/2016   Lab Results  Component Value Date   TSH 1.52 08/16/2016   No results found for: VITAMINB12       Obesity (BMI 30.0-34.9)    I have addressed  BMI and recommended wt loss of 10% of body weigh over the next 6 months using a low glycemic index diet and regular exercise a minimum of 5 days per week.         Other Visit Diagnoses    Muscle twitching    -  Primary   Relevant Orders   TSH (Completed)   Magnesium (Completed)   Basic metabolic panel (Completed)   CK (Creatine Kinase) (Completed)   Sedimentation rate (Completed)   Fatigue, unspecified type       Relevant Orders   CBC with Differential/Platelet (Completed)      I have discontinued Ms. Romagnoli's ciprofloxacin, fluconazole, and hydrochlorothiazide. I am also having her start on losartan-hydrochlorothiazide. Additionally, I am having her maintain her loratadine and fluticasone.  Meds  ordered this encounter  Medications  . losartan-hydrochlorothiazide (HYZAAR) 50-12.5 MG tablet    Sig: Take 1 tablet by mouth daily.    Dispense:  90 tablet    Refill:  3    Medications Discontinued During This Encounter  Medication Reason  . fluconazole (DIFLUCAN) 150 MG tablet Patient Preference  . ciprofloxacin (CIPRO) 500 MG tablet Completed Course  . hydrochlorothiazide (HYDRODIURIL) 25 MG tablet    A total of 25 minutes of face to face time was spent with patient more than half of which was spent in counselling about the above mentioned conditions  and coordination of care  Follow-up: No Follow-up on file.   Crecencio Mc, MD

## 2016-08-16 NOTE — Progress Notes (Signed)
Pre-visit discussion using our clinic review tool. No additional management support is needed unless otherwise documented below in the visit note.  

## 2016-08-16 NOTE — Patient Instructions (Addendum)
We are changing your BP meds from hctz to losartan hctz..  You still take it once daily in the morning .  DON'T PICK UP UNTIL YOU HEAR FROM ME ABOUT YOUR LABS

## 2016-08-17 LAB — BASIC METABOLIC PANEL
BUN: 13 mg/dL (ref 6–23)
CHLORIDE: 100 meq/L (ref 96–112)
CO2: 30 mEq/L (ref 19–32)
CREATININE: 0.84 mg/dL (ref 0.40–1.20)
Calcium: 10 mg/dL (ref 8.4–10.5)
GFR: 79.2 mL/min (ref >60.00)
Glucose, Bld: 106 mg/dL — ABNORMAL HIGH (ref 70–99)
Potassium: 4.7 mEq/L (ref 3.5–5.1)
SODIUM: 139 meq/L (ref 135–145)

## 2016-08-17 LAB — CBC WITH DIFFERENTIAL/PLATELET
BASOS ABS: 0.1 10*3/uL (ref 0.0–0.1)
Basophils Relative: 0.8 % (ref 0.0–3.0)
Eosinophils Absolute: 0.3 10*3/uL (ref 0.0–0.7)
Eosinophils Relative: 2.9 % (ref 0.0–5.0)
HCT: 35.5 % — ABNORMAL LOW (ref 36.0–46.0)
Hemoglobin: 11.8 g/dL — ABNORMAL LOW (ref 12.0–15.0)
LYMPHS ABS: 1.8 10*3/uL (ref 0.7–4.0)
Lymphocytes Relative: 19.9 % (ref 12.0–46.0)
MCHC: 33.1 g/dL (ref 30.0–36.0)
MCV: 81.4 fl (ref 78.0–100.0)
MONO ABS: 0.5 10*3/uL (ref 0.1–1.0)
MONOS PCT: 5.1 % (ref 3.0–12.0)
NEUTROS PCT: 71.3 % (ref 43.0–77.0)
Neutro Abs: 6.3 10*3/uL (ref 1.4–7.7)
Platelets: 367 10*3/uL (ref 150.0–400.0)
RBC: 4.37 Mil/uL (ref 3.87–5.11)
RDW: 14.8 % (ref 11.5–15.5)
WBC: 8.8 10*3/uL (ref 4.0–10.5)

## 2016-08-17 LAB — TSH: TSH: 1.52 u[IU]/mL (ref 0.35–4.50)

## 2016-08-17 LAB — MAGNESIUM: MAGNESIUM: 2.1 mg/dL (ref 1.5–2.5)

## 2016-08-17 LAB — SEDIMENTATION RATE: SED RATE: 14 mm/h (ref 0–20)

## 2016-08-17 LAB — CK: CK TOTAL: 106 U/L (ref 7–177)

## 2016-08-19 ENCOUNTER — Encounter: Payer: Self-pay | Admitting: Internal Medicine

## 2016-08-19 ENCOUNTER — Other Ambulatory Visit: Payer: Self-pay | Admitting: Internal Medicine

## 2016-08-19 DIAGNOSIS — I10 Essential (primary) hypertension: Secondary | ICD-10-CM

## 2016-08-19 DIAGNOSIS — R202 Paresthesia of skin: Principal | ICD-10-CM

## 2016-08-19 DIAGNOSIS — R200 Anesthesia of skin: Secondary | ICD-10-CM

## 2016-08-19 DIAGNOSIS — R253 Fasciculation: Secondary | ICD-10-CM | POA: Insufficient documentation

## 2016-08-19 NOTE — Assessment & Plan Note (Signed)
I have addressed  BMI and recommended wt loss of 10% of body weigh over the next 6 months using a low glycemic index diet and regular exercise a minimum of 5 days per week.   

## 2016-08-19 NOTE — Assessment & Plan Note (Signed)
Neurologic exam is normal and  Labs normal. Reassurance provided that her symptoms are very nonspecific and do not include dizziness., vision changes , or any other objective findings to support MRI brain and spine at this time.   Lab Results  Component Value Date   CKTOTAL 106 08/16/2016   Lab Results  Component Value Date   TSH 1.52 08/16/2016   No results found for: [id number]

## 2016-08-19 NOTE — Assessment & Plan Note (Signed)
Not well controlled on hctz alone.  Adding losartan . RTC one week for repeat BMET  Lab Results  Component Value Date   NA 139 08/16/2016   K 4.7 08/16/2016   CL 100 08/16/2016   CO2 30 08/16/2016   Lab Results  Component Value Date   CREATININE 0.84 08/16/2016

## 2016-09-09 ENCOUNTER — Other Ambulatory Visit (INDEPENDENT_AMBULATORY_CARE_PROVIDER_SITE_OTHER): Payer: 59

## 2016-09-09 DIAGNOSIS — I10 Essential (primary) hypertension: Secondary | ICD-10-CM | POA: Diagnosis not present

## 2016-09-09 DIAGNOSIS — R202 Paresthesia of skin: Secondary | ICD-10-CM

## 2016-09-09 DIAGNOSIS — R200 Anesthesia of skin: Secondary | ICD-10-CM | POA: Diagnosis not present

## 2016-09-09 LAB — BASIC METABOLIC PANEL
BUN: 9 mg/dL (ref 6–23)
CO2: 30 mEq/L (ref 19–32)
Calcium: 9.5 mg/dL (ref 8.4–10.5)
Chloride: 103 mEq/L (ref 96–112)
Creatinine, Ser: 0.8 mg/dL (ref 0.40–1.20)
GFR: 83.76 mL/min (ref >60.00)
Glucose, Bld: 88 mg/dL (ref 70–99)
POTASSIUM: 4.2 meq/L (ref 3.5–5.1)
SODIUM: 139 meq/L (ref 135–145)

## 2016-09-09 LAB — VITAMIN B12: VITAMIN B 12: 208 pg/mL — AB (ref 211–911)

## 2016-09-11 ENCOUNTER — Encounter: Payer: Self-pay | Admitting: Internal Medicine

## 2016-09-11 DIAGNOSIS — E538 Deficiency of other specified B group vitamins: Secondary | ICD-10-CM | POA: Insufficient documentation

## 2016-09-11 LAB — FOLATE RBC: RBC FOLATE: 623 ng/mL (ref >280)

## 2016-09-14 LAB — METHYLMALONIC ACID, SERUM: Methylmalonic Acid, Quant: 176 nmol/L (ref 87–318)

## 2016-09-15 ENCOUNTER — Ambulatory Visit (INDEPENDENT_AMBULATORY_CARE_PROVIDER_SITE_OTHER): Payer: 59

## 2016-09-15 DIAGNOSIS — E538 Deficiency of other specified B group vitamins: Secondary | ICD-10-CM

## 2016-09-15 MED ORDER — CYANOCOBALAMIN 1000 MCG/ML IJ SOLN
1000.0000 ug | Freq: Once | INTRAMUSCULAR | Status: AC
  Administered 2016-09-15: 1000 ug via INTRAMUSCULAR

## 2016-09-15 NOTE — Progress Notes (Signed)
Patient comes in for first weekly B 12 injection. .  Injected left deltoid.  Patient tolerated injection well.

## 2016-09-21 MED ORDER — "SYRINGE 25G X 1"" 3 ML MISC": 0 refills | Status: DC

## 2016-09-21 MED ORDER — CYANOCOBALAMIN 1000 MCG/ML IJ SOLN: 1000.0000 ug | INTRAMUSCULAR | 0 refills | Status: DC

## 2016-09-21 NOTE — Telephone Encounter (Signed)
Patient would like script for B 12 at home with needles ok to send?

## 2016-12-02 ENCOUNTER — Ambulatory Visit: Payer: Self-pay | Admitting: Family

## 2016-12-02 VITALS — BP 120/80 | HR 101 | Temp 99.2°F

## 2016-12-02 DIAGNOSIS — J0100 Acute maxillary sinusitis, unspecified: Secondary | ICD-10-CM

## 2016-12-02 MED ORDER — AZITHROMYCIN 250 MG PO TABS: ORAL_TABLET | ORAL | 0 refills | Status: DC

## 2016-12-02 NOTE — Progress Notes (Signed)
S/ chronic allergies, now with acute sxs, facial pain, teeth hurt ,malaise, increased drainage, ST - scratchy Taking dayquil   O/ low grade temp ,alert pleasant , NAD Tms clear, nasal mucosa hyperemic , boggy , with purulent rhinorhea, + frontomax tenderness, throat injected, neck supple , heart rsr lungsl clear A/ acute sinusitis P Zpack.Supportive measures discussed. Follow up prn not improving

## 2016-12-22 ENCOUNTER — Other Ambulatory Visit: Payer: Self-pay | Admitting: Internal Medicine

## 2016-12-22 DIAGNOSIS — Z1231 Encounter for screening mammogram for malignant neoplasm of breast: Secondary | ICD-10-CM

## 2017-01-12 ENCOUNTER — Ambulatory Visit
Admission: RE | Admit: 2017-01-12 | Discharge: 2017-01-12 | Disposition: A | Discharge status: Home / Self Care | Payer: 59 | Source: Ambulatory Visit | Attending: Internal Medicine | Admitting: Internal Medicine

## 2017-01-12 DIAGNOSIS — Z1231 Encounter for screening mammogram for malignant neoplasm of breast: Secondary | ICD-10-CM | POA: Insufficient documentation

## 2017-06-13 ENCOUNTER — Encounter: Payer: Self-pay | Admitting: *Deleted

## 2017-06-13 ENCOUNTER — Encounter: Payer: Self-pay | Admitting: Internal Medicine

## 2017-06-13 ENCOUNTER — Ambulatory Visit
Admission: EM | Admit: 2017-06-13 | Discharge: 2017-06-13 | Disposition: A | Discharge status: Home / Self Care | Payer: 59 | Attending: Emergency Medicine | Admitting: Emergency Medicine

## 2017-06-13 ENCOUNTER — Telehealth: Payer: Self-pay | Admitting: *Deleted

## 2017-06-13 DIAGNOSIS — R5383 Other fatigue: Secondary | ICD-10-CM | POA: Diagnosis not present

## 2017-06-13 DIAGNOSIS — R110 Nausea: Secondary | ICD-10-CM

## 2017-06-13 DIAGNOSIS — B820 Intestinal helminthiasis, unspecified: Secondary | ICD-10-CM | POA: Diagnosis not present

## 2017-06-13 MED ORDER — ONDANSETRON 8 MG PO TBDP: 8.0000 mg | ORAL_TABLET | Freq: Once | ORAL | Status: DC

## 2017-06-13 MED ORDER — ONDANSETRON 8 MG PO TBDP: 8.0000 mg | ORAL_TABLET | Freq: Three times a day (TID) | ORAL | 0 refills | Status: DC | PRN

## 2017-06-13 MED ORDER — ALBENDAZOLE 200 MG PO TABS: 400.0000 mg | ORAL_TABLET | Freq: Once | ORAL | 0 refills | Status: AC

## 2017-06-13 MED ORDER — ONDANSETRON 8 MG PO TBDP
8.0000 mg | ORAL_TABLET | Freq: Once | ORAL | Status: AC
  Administered 2017-06-13: 8 mg via ORAL

## 2017-06-13 NOTE — Telephone Encounter (Signed)
Patient stated she would go to [facility] today to be evaluated.

## 2017-06-13 NOTE — Discharge Instructions (Signed)
Take the albendazole 400 mg by mouth at once on an empty stomach. Go immediately to the ER for nausea and vomiting not controlled with Zofran, fevers above 100.4, the abdominal pain that we talked about, or for other concerns.

## 2017-06-13 NOTE — ED Triage Notes (Signed)
Patient started having symptom of abdominal pain, nausea, vomiting, and diarrhea today. Patient reports seeing a worm in her BM today also. Additional symptom of weakness and loss of appetite started 1 week ago with one episode of vomiting.

## 2017-06-13 NOTE — Telephone Encounter (Signed)
Pt has not felt well in a few day, she did vomit today once. Pt had diarrhea today as well, she described having small worms in her stool. Please advise  Pt contact [phone]

## 2017-06-13 NOTE — Telephone Encounter (Signed)
She needs to be seen.

## 2017-06-13 NOTE — Telephone Encounter (Signed)
Patient called One day last week she threw up once and was fine. Since Sunday she has had very poor appetite. This AM she felt nauseous when she woke up and vomited one time. She is still having the nausea. Patient has had diarrhea this AM 3 or 4 times. The last time she went she noticed about three 6 inch long worms in her stool. She did not look the other times. She has not checked her temp but says she feels warm. She denies any other symptoms. Please advise.

## 2017-06-13 NOTE — ED Provider Notes (Addendum)
HPI  SUBJECTIVE:  Nancy Leon is a 41 y.o. female who presents with nausea, diffuse migratory crampy abdominal pain lasting minutes and then resolving on its own and anorexia starting yesterday. She reports fatigue for "a while". States that she has had malaise for the past several days as well. Had one episode of emesis last week and has been fine up until this morning when she had one episode of nonbilious nonbloody emesis today. She also reports a watery, nonbloody, soft diarrhea like stool and noticed multiple "longish" worms that were not moving. Her abdominal pain is better with having a bowel movement, no aggravating factors. It is not associated with eating, fasting, movement. She states that she has not been walking around barefoot. They do have chickens, cats, and a dog which does not have worms. No international travel, contacts with known worm infestation, raw or undercooked foods specifically meat, fevers, abdominal distention. States that the car ride over here was not painful. No chest pain, shortness of breath, coughing, wheezing, hemoptysis, right upper quadrant pain. No perianal itching at night. Past medical history of C-section, hypertension. No history of diabetes. LMP: 9/14. Denies possibility of being pregnant. PMD: Crecencio Mc, MD  Past Medical History:  Diagnosis Date  . Allergy   . Depression   . Hypertension   . Premenstrual dysphoric disorder     Past Surgical History:  Procedure Laterality Date  . CESAREAN SECTION  2011  . TUBAL LIGATION      Family History  Problem Relation Age of Onset  . Diabetes Mother   . Hyperlipidemia Mother   . Cancer Mother 5       colon ca  . Asthma Father   . Hypertension Father   . Breast cancer Neg Hx     Social History  Substance Use Topics  . Smoking status: Never Smoker  . Smokeless tobacco: Never Used  . Alcohol use 0.0 oz/week    No current facility-administered medications for this encounter.    Current Outpatient Prescriptions:  .  cyanocobalamin (,VITAMIN B-12,) 1000 MCG/ML injection, Inject 1 mL (1,000 mcg total) into the muscle once a week. X 3, then monthly, Disp: 10 mL, Rfl: 0 .  loratadine (CLARITIN) 10 MG tablet, Take 10 mg by mouth daily., Disp: , Rfl:  .  losartan-hydrochlorothiazide (HYZAAR) 50-12.5 MG tablet, Take 1 tablet by mouth daily., Disp: 90 tablet, Rfl: 3 .  albendazole (ALBENZA) 200 MG tablet, Take 2 tablets (400 mg total) by mouth once. Take on empty stomach, Disp: 2 tablet, Rfl: 0 .  fluticasone (FLONASE) 50 MCG/ACT nasal spray, Place 2 sprays into both nostrils daily., Disp: 16 g, Rfl: 2 .  ondansetron (ZOFRAN ODT) 8 MG disintegrating tablet, Take 1 tablet (8 mg total) by mouth every 8 (eight) hours as needed for nausea or vomiting., Disp: 20 tablet, Rfl: 0 .  Syringe/Needle, Disp, (SYRINGE 3CC/25GX1") 25G X 1" 3 ML MISC, Use for b12 injections, Disp: 50 each, Rfl: 0  No Known Allergies   ROS  As noted in HPI.   Physical Exam  BP (!) 126/91 (BP Location: Left Arm)   Pulse (!) 115   Temp 99.1 F (37.3 C) (Oral)   Ht 5\' 3"  (1.6 m)   Wt 200 lb (90.7 kg)   LMP 06/02/2017   SpO2 100%   BMI 35.43 kg/m   Constitutional: Well developed, well nourished, appears anxious  Eyes:  EOMI, conjunctiva normal bilaterally HENT: Normocephalic, atraumatic,mucus membranes moist Respiratory: Normal inspiratory effort,  Lungs clear bilaterally Cardiovascular: Regular tachycardia no murmurs or gallops GI: nondistended, mild periumbilical tenderness, no guarding, rebound. Active bowel sounds. Negative Murphy, negative McBurney. Back: No CVAT skin: No rash, skin intact Musculoskeletal: no deformities Neurologic: Alert & oriented x 3, no focal neuro deficits Psychiatric: Speech and behavior appropriate   ED Course   Medications  ondansetron (ZOFRAN-ODT) disintegrating tablet 8 mg (8 mg Oral Given 06/13/17 1317)    Orders Placed This Encounter  Procedures  .  Miscellaneous [facility] test (send-out)    Standing Status:   Standing    Number of Occurrences:   1    Order Specific Question:   Test name / description:    Answer:   gross worm identification [id number]  . Miscellaneous [facility] test (send-out)    Order Specific Question:   Test name / description:    Answer:   Evaluate for worms and identify worm species    No results found for this or any previous visit (from the past 24 hour(s)). No results found.  ED Clinical Impression  Nausea  Intestinal worms   ED Assessment/Plan  No evidence of extra intestinal involvement. She has a benign abdomen. Up to date reviewed.  Albendazole 400 mg 1 on an empty stomach is effective for ascaris and hookworm, so will try this first. If this doesn't work then consider Praziquantel for tapeworm. It does not sound like pinworm.   Giving Zofran here, also home with Zofran for nausea. Advised her that this may make her constipated. Sending off a stool sample for gross worm identification. She will need to follow up with her primary care physician as needed, to the ER if she gets worse.  Placed a future order for gross worm identification by writing it out on a prescription pad.  Discussed labs, MDM, plan and followup with patient. Discussed sn/sx that should prompt return to the ED. Patient agrees with plan.   Meds ordered this encounter  Medications  . ondansetron (ZOFRAN-ODT) disintegrating tablet 8 mg  . DISCONTD: ondansetron (ZOFRAN-ODT) disintegrating tablet 8 mg  . ondansetron (ZOFRAN ODT) 8 MG disintegrating tablet    Sig: Take 1 tablet (8 mg total) by mouth every 8 (eight) hours as needed for nausea or vomiting.    Dispense:  20 tablet    Refill:  0  . albendazole (ALBENZA) 200 MG tablet    Sig: Take 2 tablets (400 mg total) by mouth once. Take on empty stomach    Dispense:  2 tablet    Refill:  0    *This clinic note was created using [employer]. Therefore, there may be  occasional mistakes despite careful proofreading.  ?   Melynda Ripple, MD 06/13/17 [place], Harly Pipkins, MD 06/13/17 [phone]

## 2017-06-14 NOTE — Telephone Encounter (Signed)
Error

## 2017-06-16 ENCOUNTER — Other Ambulatory Visit
Admission: RE | Admit: 2017-06-16 | Discharge: 2017-06-16 | Disposition: A | Discharge status: Home / Self Care | Payer: 59 | Source: Ambulatory Visit | Attending: Emergency Medicine | Admitting: Emergency Medicine

## 2017-06-16 DIAGNOSIS — B820 Intestinal helminthiasis, unspecified: Secondary | ICD-10-CM | POA: Insufficient documentation

## 2017-06-18 LAB — MISC LABCORP TEST (SEND OUT): Labcorp test code: 8219

## 2017-07-18 ENCOUNTER — Ambulatory Visit (INDEPENDENT_AMBULATORY_CARE_PROVIDER_SITE_OTHER): Payer: 59 | Admitting: Internal Medicine

## 2017-07-18 ENCOUNTER — Encounter: Payer: Self-pay | Admitting: Internal Medicine

## 2017-07-18 VITALS — BP 108/76 | HR 111 | Temp 98.4°F | Resp 15 | Ht 63.0 in | Wt 206.4 lb

## 2017-07-18 DIAGNOSIS — E669 Obesity, unspecified: Secondary | ICD-10-CM

## 2017-07-18 DIAGNOSIS — R87810 Cervical high risk human papillomavirus (HPV) DNA test positive: Secondary | ICD-10-CM | POA: Diagnosis not present

## 2017-07-18 DIAGNOSIS — E538 Deficiency of other specified B group vitamins: Secondary | ICD-10-CM

## 2017-07-18 DIAGNOSIS — R000 Tachycardia, unspecified: Secondary | ICD-10-CM | POA: Diagnosis not present

## 2017-07-18 DIAGNOSIS — Z0000 Encounter for general adult medical examination without abnormal findings: Secondary | ICD-10-CM | POA: Diagnosis not present

## 2017-07-18 DIAGNOSIS — R635 Abnormal weight gain: Secondary | ICD-10-CM

## 2017-07-18 DIAGNOSIS — I10 Essential (primary) hypertension: Secondary | ICD-10-CM | POA: Diagnosis not present

## 2017-07-18 DIAGNOSIS — R0683 Snoring: Secondary | ICD-10-CM

## 2017-07-18 NOTE — Progress Notes (Signed)
Subjective:  Patient ID: Nancy Leon, female    DOB: [birth year]  Age: 41 y.o. MRN: [id number]  CC: The primary encounter diagnosis was B12 deficiency. Diagnoses of Weight gain, Encounter for preventive health examination, Obesity (BMI 30.0-34.9), Cervical high risk human papillomavirus (HPV) DNA test positive, Essential hypertension, benign, Sinus tachycardia, and Snoring were also pertinent to this visit.  HPI [name] presents for follow up on hypertension ,  Obesity .   Took an otc remedy for presumed pinworms (12 yr old daughter had a visible infection,  Her stool test was negative)  But she had seen one in her own stool after walking up with nausea and diarrhea.  Entire family got treated,  No recurrence of symptoms.    Hypertension: patient checks blood pressure twice weekly at home.  Readings have been for the most part   < 140/80 at rest . Patient is following a reduce dsalt diet most days and is taking medications as prescribed.  She is not exercising regularly or following a caloric restricted diet.  She is however frustrated with her weight.   Outpatient Medications Prior to Visit  Medication Sig Dispense Refill  . cyanocobalamin (,VITAMIN B-12,) 1000 MCG/ML injection Inject 1 mL (1,000 mcg total) into the muscle once a week. X 3, then monthly 10 mL 0  . fluticasone (FLONASE) 50 MCG/ACT nasal spray Place 2 sprays into both nostrils daily. 16 g 2  . loratadine (CLARITIN) 10 MG tablet Take 10 mg by mouth daily.    Marland Kitchen losartan-hydrochlorothiazide (HYZAAR) 50-12.5 MG tablet Take 1 tablet by mouth daily. 90 tablet 3  . Syringe/Needle, Disp, (SYRINGE 3CC/25GX1") 25G X 1" 3 ML MISC Use for b12 injections 50 each 0  . ondansetron (ZOFRAN ODT) 8 MG disintegrating tablet Take 1 tablet (8 mg total) by mouth every 8 (eight) hours as needed for nausea or vomiting. (Patient not taking: Reported on 07/18/2017) 20 tablet 0   No facility-administered medications prior to  visit.     Review of Systems;  Patient denies headache, fevers, malaise, unintentional weight loss, skin rash, eye pain, sinus congestion and sinus pain, sore throat, dysphagia,  hemoptysis , cough, dyspnea, wheezing, chest pain, palpitations, orthopnea, edema, abdominal pain, nausea, melena, diarrhea, constipation, flank pain, dysuria, hematuria, urinary  Frequency, nocturia, numbness, tingling, seizures,  Focal weakness, Loss of consciousness,  Tremor, insomnia, depression, anxiety, and suicidal ideation.      Objective:  BP 108/76 (BP Location: Left Arm, Patient Position: Sitting, Cuff Size: Large)   Pulse (!) 111   Temp 98.4 F (36.9 C) (Oral)   Resp 15   Ht 5\' 3"  (1.6 m)   Wt 206 lb 6.4 oz (93.6 kg)   SpO2 97%   BMI 36.56 kg/m   BP Readings from Last 3 Encounters:  07/18/17 108/76  06/13/17 (!) 126/91  12/02/16 120/80    Wt Readings from Last 3 Encounters:  07/18/17 206 lb 6.4 oz (93.6 kg)  06/13/17 200 lb (90.7 kg)  08/16/16 203 lb 8 oz (92.3 kg)    General appearance: alert, cooperative and appears stated age Ears: normal TM's and external ear canals both ears Throat: lips, mucosa, and tongue normal; teeth and gums normal Neck: no adenopathy, no carotid bruit, supple, symmetrical, trachea midline and thyroid not enlarged, symmetric, no tenderness/mass/nodules Back: symmetric, no curvature. ROM normal. No CVA tenderness. Lungs: clear to auscultation bilaterally Heart: regular rate and rhythm, S1, S2 normal, no murmur, click, rub or gallop Abdomen: soft, non-tender;  bowel sounds normal; no masses,  no organomegaly Pulses: 2+ and symmetric Skin: Skin color, texture, turgor normal. No rashes or lesions Lymph nodes: Cervical, supraclavicular, and axillary nodes normal.   No results found for: HGBA1C  Lab Results  Component Value Date   CREATININE 0.77 07/18/2017   CREATININE 0.80 09/09/2016   CREATININE 0.84 08/16/2016    Lab Results  Component Value Date    WBC 8.8 08/16/2016   HGB 11.8 (L) 08/16/2016   HCT 35.5 (L) 08/16/2016   PLT 367.0 08/16/2016   GLUCOSE 86 07/18/2017   CHOL 181 07/18/2017   TRIG 112.0 07/18/2017   HDL 48.90 07/18/2017   LDLDIRECT 109.0 08/05/2015   LDLCALC 110 (H) 07/18/2017   ALT 19 07/18/2017   AST 15 07/18/2017   NA 137 07/18/2017   K 4.0 07/18/2017   CL 100 07/18/2017   CREATININE 0.77 07/18/2017   BUN 14 07/18/2017   CO2 30 07/18/2017   TSH 1.64 07/18/2017   MICROALBUR 0.8 04/24/2013    No results found.  Assessment & Plan:   Problem List Items Addressed This Visit    B12 deficiency - Primary   Relevant Orders   Vitamin B12 (Completed)   Intrinsic Factor Antibodies   Cervical high risk human papillomavirus (HPV) DNA test positive    She is overdue for Pap smear and will return in 6 months for follow-up.      Encounter for preventive health examination   Essential hypertension, benign    Well controlled on current regimen. Renal function stable, no changes today.      Obesity (BMI 30.0-34.9)    I have addressed  BMI and recommended wt loss of 10% of body weight over the next 6 months using a low glycemic index diet and regular exercise a minimum of 5 days per week.        Sinus tachycardia   Snoring    Now chronic.   Reviewed medications and caffeine intake.  She does not have excessive caffeine intake and her medications are not the cause.Likely due to deconditioning.  Recommended regular exercise        Other Visit Diagnoses    Weight gain       Relevant Orders   Comprehensive metabolic panel (Completed)   Lipid panel (Completed)   TSH (Completed)      I am having Ms. Lefeber maintain her loratadine, fluticasone, losartan-hydrochlorothiazide, cyanocobalamin, SYRINGE 3CC/25GX1", and ondansetron.  No orders of the defined types were placed in this encounter.   There are no discontinued medications.  Follow-up: No Follow-up on file.   Crecencio Mc, MD

## 2017-07-18 NOTE — Patient Instructions (Addendum)
Your blood pressure is fine!  Your heart rate may be elevated due to caffeine, mild dehdyration, or deconditioning (breing out of shape)  Try to reduce your caffeine  intake daily and INCREASE your water intake to a minimum of 60 ounces daily  START a walking program ,  Just 30 minutes thee days per week until you can devote more time to yourself   Return for PAP smear in 6 months

## 2017-07-19 LAB — COMPREHENSIVE METABOLIC PANEL WITH GFR
ALT: 19 U/L (ref 0–35)
AST: 15 U/L (ref 0–37)
Albumin: 4.4 g/dL (ref 3.5–5.2)
Alkaline Phosphatase: 58 U/L (ref 39–117)
BUN: 14 mg/dL (ref 6–23)
CO2: 30 meq/L (ref 19–32)
Calcium: 9.7 mg/dL (ref 8.4–10.5)
Chloride: 100 meq/L (ref 96–112)
Creatinine, Ser: 0.77 mg/dL (ref 0.40–1.20)
GFR: 87.18 mL/min
Glucose, Bld: 86 mg/dL (ref 70–99)
Potassium: 4 meq/L (ref 3.5–5.1)
Sodium: 137 meq/L (ref 135–145)
Total Bilirubin: 0.4 mg/dL (ref 0.2–1.2)
Total Protein: 8 g/dL (ref 6.0–8.3)

## 2017-07-19 LAB — LIPID PANEL
Cholesterol: 181 mg/dL (ref 0–200)
HDL: 48.9 mg/dL (ref >39.00)
LDL CALC: 110 mg/dL — AB (ref 0–99)
NONHDL: 132.38
Total CHOL/HDL Ratio: 4
Triglycerides: 112 mg/dL (ref 0.0–149.0)
VLDL: 22.4 mg/dL (ref 0.0–40.0)

## 2017-07-19 LAB — VITAMIN B12: Vitamin B-12: >1500 pg/mL — ABNORMAL HIGH (ref 211–911)

## 2017-07-19 LAB — TSH: TSH: 1.64 u[IU]/mL (ref 0.35–4.50)

## 2017-07-20 DIAGNOSIS — R000 Tachycardia, unspecified: Secondary | ICD-10-CM | POA: Insufficient documentation

## 2017-07-20 NOTE — Assessment & Plan Note (Addendum)
Now chronic.   Reviewed medications and caffeine intake.  She does not have excessive caffeine intake and her medications are not the cause.Likely due to deconditioning.  Recommended regular exercise

## 2017-07-20 NOTE — Assessment & Plan Note (Signed)
She is overdue for Pap smear and will return in 6 months for follow-up. 

## 2017-07-20 NOTE — Assessment & Plan Note (Signed)
Well controlled on current regimen. Renal function stable, no changes today. 

## 2017-07-20 NOTE — Assessment & Plan Note (Signed)
I have addressed  BMI and recommended wt loss of 10% of body weight over the next 6 months using a low glycemic index diet and regular exercise a minimum of 5 days per week.   

## 2017-07-21 LAB — INTRINSIC FACTOR ANTIBODIES: Intrinsic Factor: NEGATIVE

## 2017-07-23 ENCOUNTER — Encounter: Payer: Self-pay | Admitting: Internal Medicine

## 2017-08-02 DIAGNOSIS — H5213 Myopia, bilateral: Secondary | ICD-10-CM | POA: Diagnosis not present

## 2017-08-27 ENCOUNTER — Other Ambulatory Visit: Payer: Self-pay | Admitting: Internal Medicine

## 2017-11-25 ENCOUNTER — Other Ambulatory Visit: Payer: Self-pay | Admitting: Internal Medicine

## 2018-01-19 ENCOUNTER — Other Ambulatory Visit: Payer: Self-pay | Admitting: Internal Medicine

## 2018-01-19 DIAGNOSIS — Z1231 Encounter for screening mammogram for malignant neoplasm of breast: Secondary | ICD-10-CM

## 2018-01-25 ENCOUNTER — Ambulatory Visit: Payer: Self-pay | Admitting: Internal Medicine

## 2018-02-06 ENCOUNTER — Ambulatory Visit
Admission: RE | Admit: 2018-02-06 | Discharge: 2018-02-06 | Disposition: A | Discharge status: Home / Self Care | Payer: 59 | Source: Ambulatory Visit | Attending: Internal Medicine | Admitting: Internal Medicine

## 2018-02-06 DIAGNOSIS — Z1231 Encounter for screening mammogram for malignant neoplasm of breast: Secondary | ICD-10-CM | POA: Diagnosis not present

## 2018-02-28 ENCOUNTER — Ambulatory Visit (INDEPENDENT_AMBULATORY_CARE_PROVIDER_SITE_OTHER): Payer: 59 | Admitting: Internal Medicine

## 2018-02-28 ENCOUNTER — Encounter: Payer: Self-pay | Admitting: Internal Medicine

## 2018-02-28 VITALS — BP 124/76 | HR 92 | Temp 98.1°F | Resp 15 | Ht 63.0 in | Wt 205.2 lb

## 2018-02-28 DIAGNOSIS — E669 Obesity, unspecified: Secondary | ICD-10-CM

## 2018-02-28 DIAGNOSIS — I10 Essential (primary) hypertension: Secondary | ICD-10-CM

## 2018-02-28 DIAGNOSIS — E538 Deficiency of other specified B group vitamins: Secondary | ICD-10-CM

## 2018-02-28 DIAGNOSIS — J301 Allergic rhinitis due to pollen: Secondary | ICD-10-CM

## 2018-02-28 LAB — VITAMIN B12: VITAMIN B 12: 583 pg/mL (ref 211–911)

## 2018-02-28 LAB — COMPREHENSIVE METABOLIC PANEL
ALK PHOS: 61 U/L (ref 39–117)
ALT: 16 U/L (ref 0–35)
AST: 16 U/L (ref 0–37)
Albumin: 4.2 g/dL (ref 3.5–5.2)
BUN: 10 mg/dL (ref 6–23)
CO2: 29 mEq/L (ref 19–32)
CREATININE: 0.81 mg/dL (ref 0.40–1.20)
Calcium: 9.7 mg/dL (ref 8.4–10.5)
Chloride: 101 mEq/L (ref 96–112)
GFR: 81.99 mL/min (ref >60.00)
GLUCOSE: 94 mg/dL (ref 70–99)
POTASSIUM: 4.6 meq/L (ref 3.5–5.1)
SODIUM: 136 meq/L (ref 135–145)
TOTAL PROTEIN: 7.5 g/dL (ref 6.0–8.3)
Total Bilirubin: 0.5 mg/dL (ref 0.2–1.2)

## 2018-02-28 NOTE — Progress Notes (Signed)
Subjective:  Patient ID: Nancy Leon, female    DOB: November 04, 1975  Age: 42 y.o. MRN: [id number]  CC: The primary encounter diagnosis was B12 deficiency. Diagnoses of Essential hypertension, Seasonal allergic rhinitis due to pollen, Essential hypertension, benign, and Obesity (BMI 30.0-34.9) were also pertinent to this visit.  HPI Nancy Leon presents for follow upon hypertension .  She has been having Some allergy symptoms,  Has been using claritin daily for years but has been having symptoms  Of rhinitis and cough aggravateed by being outside.   Exercise  limited by the presence of wasps in her attic where her Elliptiglider  Is positioned.    Not checking BP but has a wrist cuff.   Has noted some Numbness , right medial thigh constant  Since her C section    averaging 7 hours of sleep. Snores. Sleep study negative    Outpatient Medications Prior to Visit  Medication Sig Dispense Refill  . fluticasone (FLONASE) 50 MCG/ACT nasal spray Place 2 sprays into both nostrils daily. 16 g 2  . loratadine (CLARITIN) 10 MG tablet Take 10 mg by mouth daily.    Marland Kitchen losartan-hydrochlorothiazide (HYZAAR) 50-12.5 MG tablet TAKE 1 TABLET BY MOUTH DAILY 90 tablet 1  . cyanocobalamin (,VITAMIN B-12,) 1000 MCG/ML injection Inject 1 mL (1,000 mcg total) into the muscle once a week. X 3, then monthly (Patient not taking: Reported on 02/28/2018) 10 mL 0  . ondansetron (ZOFRAN ODT) 8 MG disintegrating tablet Take 1 tablet (8 mg total) by mouth every 8 (eight) hours as needed for nausea or vomiting. (Patient not taking: Reported on 07/18/2017) 20 tablet 0  . Syringe/Needle, Disp, (SYRINGE 3CC/25GX1") 25G X 1" 3 ML MISC Use for b12 injections (Patient not taking: Reported on 02/28/2018) 50 each 0   No facility-administered medications prior to visit.     Review of Systems;  Patient denies headache, fevers, malaise, unintentional weight loss, skin rash, eye pain, sinus congestion and sinus  pain, sore throat, dysphagia,  hemoptysis , cough, dyspnea, wheezing, chest pain, palpitations, orthopnea, edema, abdominal pain, nausea, melena, diarrhea, constipation, flank pain, dysuria, hematuria, urinary  Frequency, nocturia, numbness, tingling, seizures,  Focal weakness, Loss of consciousness,  Tremor, insomnia, depression, anxiety, and suicidal ideation.      Objective:  BP 124/76 (BP Location: Left Arm, Patient Position: Sitting, Cuff Size: Normal)   Pulse 92   Temp 98.1 F (36.7 C) (Oral)   Resp 15   Ht 5\' 3"  (1.6 m)   Wt 205 lb 3.2 oz (93.1 kg)   SpO2 98%   BMI 36.35 kg/m   BP Readings from Last 3 Encounters:  02/28/18 124/76  07/18/17 108/76  06/13/17 (!) 126/91    Wt Readings from Last 3 Encounters:  02/28/18 205 lb 3.2 oz (93.1 kg)  07/18/17 206 lb 6.4 oz (93.6 kg)  06/13/17 200 lb (90.7 kg)    General appearance: alert, cooperative and appears stated age Ears: normal TM's and external ear canals both ears Throat: lips, mucosa, and tongue normal; teeth and gums normal Neck: no adenopathy, no carotid bruit, supple, symmetrical, trachea midline and thyroid not enlarged, symmetric, no tenderness/mass/nodules Back: symmetric, no curvature. ROM normal. No CVA tenderness. Lungs: clear to auscultation bilaterally Heart: regular rate and rhythm, S1, S2 normal, no murmur, click, rub or gallop Abdomen: soft, non-tender; bowel sounds normal; no masses,  no organomegaly Pulses: 2+ and symmetric Skin: Skin color, texture, turgor normal. No rashes or lesions Lymph nodes: Cervical, supraclavicular, and  axillary nodes normal.  No results found for: HGBA1C  Lab Results  Component Value Date   CREATININE 0.81 02/28/2018   CREATININE 0.77 07/18/2017   CREATININE 0.80 09/09/2016    Lab Results  Component Value Date   WBC 8.8 08/16/2016   HGB 11.8 (L) 08/16/2016   HCT 35.5 (L) 08/16/2016   PLT 367.0 08/16/2016   GLUCOSE 94 02/28/2018   CHOL 181 07/18/2017   TRIG  112.0 07/18/2017   HDL 48.90 07/18/2017   LDLDIRECT 109.0 08/05/2015   LDLCALC 110 (H) 07/18/2017   ALT 16 02/28/2018   AST 16 02/28/2018   NA 136 02/28/2018   K 4.6 02/28/2018   CL 101 02/28/2018   CREATININE 0.81 02/28/2018   BUN 10 02/28/2018   CO2 29 02/28/2018   TSH 1.64 07/18/2017   MICROALBUR 0.8 04/24/2013    Mm 3d Screen Breast Bilateral  Result Date: 02/06/2018 CLINICAL DATA:  Screening. EXAM: DIGITAL SCREENING BILATERAL MAMMOGRAM WITH TOMO AND CAD COMPARISON:  Previous exam(s). ACR Breast Density Category c: The breast tissue is heterogeneously dense, which may obscure small masses. FINDINGS: There are no findings suspicious for malignancy. Images were processed with CAD. IMPRESSION: No mammographic evidence of malignancy. A result letter of this screening mammogram will be mailed directly to the patient. RECOMMENDATION: Screening mammogram in one year. (Code:SM-B-01Y) BI-RADS CATEGORY  1: Negative. Electronically Signed   By: Fidela Salisbury M.D.   On: 02/06/2018 15:10    Assessment & Plan:   Problem List Items Addressed This Visit    B12 deficiency - Primary   Relevant Orders   B12 (Completed)   Obesity (BMI 30.0-34.9)    I have addressed  BMI and recommended wt loss of 10% of body weight over the next 6 months using a low glycemic index diet and regular exercise a minimum of 5 days per week.        Essential hypertension, benign    Well controlled on current regimen. Renal function stable, no changes today.  Lab Results  Component Value Date   CREATININE 0.81 02/28/2018   Lab Results  Component Value Date   NA 136 02/28/2018   K 4.6 02/28/2018   CL 101 02/28/2018   CO2 29 02/28/2018         Allergic rhinitis due to pollen     Trial f claritin bid, and saline irrigations..  Can add steroid nasal spray if needed        Other Visit Diagnoses    Essential hypertension       Relevant Orders   Comprehensive metabolic panel (Completed)     A  total of 25 minutes of face to face time was spent with patient more than half of which was spent in counselling about the above mentioned conditions  and coordination of care   I have discontinued Benjamine Mola T. Quesinberry's cyanocobalamin, SYRINGE 3CC/25GX1", and ondansetron. I am also having her maintain her loratadine, fluticasone, and losartan-hydrochlorothiazide.  No orders of the defined types were placed in this encounter.   Medications Discontinued During This Encounter  Medication Reason  . cyanocobalamin (,VITAMIN B-12,) 1000 MCG/ML injection Patient has not taken in last 30 days  . ondansetron (ZOFRAN ODT) 8 MG disintegrating tablet Patient has not taken in last 30 days  . Syringe/Needle, Disp, (SYRINGE 3CC/25GX1") 25G X 1" 3 ML MISC Patient has not taken in last 30 days    Follow-up: Return in about 6 months (around 08/30/2018) for CPE with PAP SMEAR.  Crecencio Mc, MD

## 2018-02-28 NOTE — Patient Instructions (Addendum)
For your allergy symptoms   You can increase claritin to twice daily   You can also try adding a daily irrigation with  NeilMed's Sinus rinse ;  It is a strong sinus "flush" using water and medicated salts.  Do it over the sink because it can be a bit messy   Managing Your Hypertension Hypertension is commonly called high blood pressure. This is when the force of your blood pressing against the walls of your arteries is too strong. Arteries are blood vessels that carry blood from your heart throughout your body. Hypertension forces the heart to work harder to pump blood, and may cause the arteries to become narrow or stiff. Having untreated or uncontrolled hypertension can cause heart attack, stroke, kidney disease, and other problems. What are blood pressure readings? A blood pressure reading consists of a higher number over a lower number. Ideally, your blood pressure should be below 120/80. The first ("top") number is called the systolic pressure. It is a measure of the pressure in your arteries as your heart beats. The second ("bottom") number is called the diastolic pressure. It is a measure of the pressure in your arteries as the heart relaxes. What does my blood pressure reading mean? Blood pressure is classified into four stages. Based on your blood pressure reading, your health care provider may use the following stages to determine what type of treatment you need, if any. Systolic pressure and diastolic pressure are measured in a unit called mm Hg. Normal  Systolic pressure: below 644.  Diastolic pressure: below 80. Elevated  Systolic pressure: 034-742.  Diastolic pressure: below 80. Hypertension stage 1  Systolic pressure: 595-638.  Diastolic pressure: 75-64. Hypertension stage 2  Systolic pressure: 332 or above.  Diastolic pressure: 90 or above. What health risks are associated with hypertension? Managing your hypertension is an important responsibility. Uncontrolled  hypertension can lead to:  A heart attack.  A stroke.  A weakened blood vessel (aneurysm).  Heart failure.  Kidney damage.  Eye damage.  Metabolic syndrome.  Memory and concentration problems.  What changes can I make to manage my hypertension? Hypertension can be managed by making lifestyle changes and possibly by taking medicines. Your health care provider will help you make a plan to bring your blood pressure within a normal range. Eating and drinking  Eat a diet that is high in fiber and potassium, and low in salt (sodium), added sugar, and fat. An example eating plan is called the DASH (Dietary Approaches to Stop Hypertension) diet. To eat this way: ? Eat plenty of fresh fruits and vegetables. Try to fill half of your plate at each meal with fruits and vegetables. ? Eat whole grains, such as whole wheat pasta, brown rice, or whole grain bread. Fill about one quarter of your plate with whole grains. ? Eat low-fat diary products. ? Avoid fatty cuts of meat, processed or cured meats, and poultry with skin. Fill about one quarter of your plate with lean proteins such as fish, chicken without skin, beans, eggs, and tofu. ? Avoid premade and processed foods. These tend to be higher in sodium, added sugar, and fat.  Reduce your daily sodium intake. Most people with hypertension should eat less than 1,500 mg of sodium a day.  Limit alcohol intake to no more than 1 drink a day for nonpregnant women and 2 drinks a day for men. One drink equals 12 oz of beer, 5 oz of wine, or 1 oz of hard liquor. Lifestyle  Work with your health care provider to maintain a healthy body weight, or to lose weight. Ask what an ideal weight is for you.  Get at least 30 minutes of exercise that causes your heart to beat faster (aerobic exercise) most days of the week. Activities may include walking, swimming, or biking.  Include exercise to strengthen your muscles (resistance exercise), such as weight  lifting, as part of your weekly exercise routine. Try to do these types of exercises for 30 minutes at least 3 days a week.  Do not use any products that contain nicotine or tobacco, such as cigarettes and e-cigarettes. If you need help quitting, ask your health care provider.  Control any long-term (chronic) conditions you have, such as high cholesterol or diabetes. Monitoring  Monitor your blood pressure at home as told by your health care provider. Your personal target blood pressure may vary depending on your medical conditions, your age, and other factors.  Have your blood pressure checked regularly, as often as told by your health care provider. Working with your health care provider  Review all the medicines you take with your health care provider because there may be side effects or interactions.  Talk with your health care provider about your diet, exercise habits, and other lifestyle factors that may be contributing to hypertension.  Visit your health care provider regularly. Your health care provider can help you create and adjust your plan for managing hypertension. Will I need medicine to control my blood pressure? Your health care provider may prescribe medicine if lifestyle changes are not enough to get your blood pressure under control, and if:  Your systolic blood pressure is 130 or higher.  Your diastolic blood pressure is 80 or higher.  Take medicines only as told by your health care provider. Follow the directions carefully. Blood pressure medicines must be taken as prescribed. The medicine does not work as well when you skip doses. Skipping doses also puts you at risk for problems. Contact a health care provider if:  You think you are having a reaction to medicines you have taken.  You have repeated (recurrent) headaches.  You feel dizzy.  You have swelling in your ankles.  You have trouble with your vision. Get help right away if:  You develop a severe  headache or confusion.  You have unusual weakness or numbness, or you feel faint.  You have severe pain in your chest or abdomen.  You vomit repeatedly.  You have trouble breathing. Summary  Hypertension is when the force of blood pumping through your arteries is too strong. If this condition is not controlled, it may put you at risk for serious complications.  Your personal target blood pressure may vary depending on your medical conditions, your age, and other factors. For most people, a normal blood pressure is less than 120/80.  Hypertension is managed by lifestyle changes, medicines, or both. Lifestyle changes include weight loss, eating a healthy, low-sodium diet, exercising more, and limiting alcohol. This information is not intended to replace advice given to you by your health care provider. Make sure you discuss any questions you have with your health care provider. Document Released: 05/30/2012 Document Revised: 08/03/2016 Document Reviewed: 08/03/2016 Elsevier Interactive Patient Education  [employer].

## 2018-03-03 DIAGNOSIS — J301 Allergic rhinitis due to pollen: Secondary | ICD-10-CM | POA: Insufficient documentation

## 2018-03-03 NOTE — Assessment & Plan Note (Signed)
Well controlled on current regimen. Renal function stable, no changes today.  Lab Results  Component Value Date   CREATININE 0.81 02/28/2018   Lab Results  Component Value Date   NA 136 02/28/2018   K 4.6 02/28/2018   CL 101 02/28/2018   CO2 29 02/28/2018

## 2018-03-03 NOTE — Assessment & Plan Note (Signed)
I have addressed  BMI and recommended wt loss of 10% of body weight over the next 6 months using a low glycemic index diet and regular exercise a minimum of 5 days per week.   

## 2018-03-03 NOTE — Assessment & Plan Note (Signed)
Trial f claritin bid, and saline irrigations..  Can add steroid nasal spray if needed

## 2018-05-25 ENCOUNTER — Other Ambulatory Visit: Payer: Self-pay | Admitting: Internal Medicine

## 2018-09-04 DIAGNOSIS — H5213 Myopia, bilateral: Secondary | ICD-10-CM | POA: Diagnosis not present

## 2018-11-29 ENCOUNTER — Other Ambulatory Visit: Payer: Self-pay | Admitting: Internal Medicine

## 2019-03-11 ENCOUNTER — Other Ambulatory Visit: Payer: Self-pay

## 2019-03-11 MED ORDER — LOSARTAN POTASSIUM-HCTZ 50-12.5 MG PO TABS: 1.0000 | ORAL_TABLET | Freq: Every day | ORAL | 0 refills | Status: DC

## 2019-03-13 ENCOUNTER — Other Ambulatory Visit: Payer: Self-pay

## 2019-03-13 ENCOUNTER — Ambulatory Visit (INDEPENDENT_AMBULATORY_CARE_PROVIDER_SITE_OTHER): Payer: 59 | Admitting: Internal Medicine

## 2019-03-13 ENCOUNTER — Encounter: Payer: Self-pay | Admitting: Internal Medicine

## 2019-03-13 DIAGNOSIS — E669 Obesity, unspecified: Secondary | ICD-10-CM

## 2019-03-13 DIAGNOSIS — Z800 Family history of malignant neoplasm of digestive organs: Secondary | ICD-10-CM | POA: Diagnosis not present

## 2019-03-13 DIAGNOSIS — I10 Essential (primary) hypertension: Secondary | ICD-10-CM | POA: Diagnosis not present

## 2019-03-13 DIAGNOSIS — Z1239 Encounter for other screening for malignant neoplasm of breast: Secondary | ICD-10-CM

## 2019-03-13 DIAGNOSIS — Z7189 Other specified counseling: Secondary | ICD-10-CM

## 2019-03-13 DIAGNOSIS — R87810 Cervical high risk human papillomavirus (HPV) DNA test positive: Secondary | ICD-10-CM

## 2019-03-13 DIAGNOSIS — E538 Deficiency of other specified B group vitamins: Secondary | ICD-10-CM

## 2019-03-13 NOTE — Progress Notes (Signed)
Virtual Visit via [email or web address]  This visit type was conducted due to national recommendations for restrictions regarding the COVID-19 pandemic (e.g. social distancing).  This format is felt to be most appropriate for this patient at this time.  All issues noted in this document were discussed and addressed.  No physical exam was performed (except for noted visual exam findings with Video Visits).   I connected with@ on 03/13/19 at  4:00 PM EDT by a video enabled telemedicine application and verified  That I am speaking with the correct person using two identifiers. Location patient: home Location provider: work or home office Persons participating in the virtual visit: patient, provider  I discussed the limitations, risks, security and privacy concerns of performing an evaluation and management service by telephone and the availability of in person appointments. I also discussed with the patient that there may be a patient responsible charge related to this service. The patient expressed understanding and agreed to proceed.   Reason for visit: follow up on hypertension   HPI:   43 yr old female with hypertension and b12 deficiency last seen June 2019   Has been compliant with medications but has not checked blood pressure since last visit.    The patient has no signs or symptoms of COVID 19 infection (fever, cough, sore throat  or shortness of breath beyond what is typical for patient).  Patient denies contact with other persons with the above mentioned symptoms or with anyone confirmed to have COVID 19    Mammogram due may 2020  Patient is working from home 5 days per week.  Not exercising or following a careful diet .  Has not been monitoring weight    ROS: Patient denies headache, fevers, malaise, unintentional weight loss, skin rash, eye pain, sinus congestion and sinus pain, sore throat, dysphagia,  hemoptysis , cough, dyspnea, wheezing, chest pain, palpitations, orthopnea, edema, abdominal  pain, nausea, melena, diarrhea, constipation, flank pain, dysuria, hematuria, urinary  Frequency, nocturia, numbness, tingling, seizures,  Focal weakness, Loss of consciousness,  Tremor, insomnia, depression, anxiety, and suicidal ideation.      Past Medical History:  Diagnosis Date  . Allergy   . Depression   . Hypertension   . Premenstrual dysphoric disorder     Past Surgical History:  Procedure Laterality Date  . CESAREAN SECTION  2011  . TUBAL LIGATION      Family History  Problem Relation Age of Onset  . Diabetes Mother   . Hyperlipidemia Mother   . Cancer Mother 76       colon ca  . Hypertension Father   . COPD Father   . Breast cancer Neg Hx     SOCIAL HX:  reports that she has never smoked. She has never used smokeless tobacco. She reports current alcohol use. She reports that she does not use drugs.   Current Outpatient Medications:  .  fluticasone (FLONASE) 50 MCG/ACT nasal spray, Place 2 sprays into both nostrils daily., Disp: 16 g, Rfl: 2 .  loratadine (CLARITIN) 10 MG tablet, Take 10 mg by mouth daily., Disp: , Rfl:  .  losartan-hydrochlorothiazide (HYZAAR) 50-12.5 MG tablet, Take 1 tablet by mouth daily., Disp: 30 tablet, Rfl: 0  EXAM:  VITALS per patient if applicable:  GENERAL: alert, oriented, appears well and in no acute distress  HEENT: atraumatic, conjunttiva clear, no obvious abnormalities on inspection of external nose and ears  NECK: normal movements of the head and neck  LUNGS: on inspection no signs of  respiratory distress, breathing rate appears normal, no obvious gross SOB, gasping or wheezing  CV: no obvious cyanosis  MS: moves all visible extremities without noticeable abnormality  PSYCH/NEURO: pleasant and cooperative, no obvious depression or anxiety, speech and thought processing grossly intact  ASSESSMENT AND PLAN:  Discussed the following assessment and plan:  Essential hypertension, benign Has not had BP checked in nearly  a year.  Medication refilled and patient advised that without a few home checks and lab visit to check lytes/cr the medication cannot be refilled.    Obesity (BMI 30.0-34.9) I have addressed  BMI and recommended wt loss of 10% of body weight over the next 6 months using a low fat, fruit/vegetable based Mediteranean diet and regular exercise a minimum of 5 days per week.    Family history of colon cancer in mother Diagnosed at age 61, with recurrent recurrence.  She will start screening at age 1  Educated About Covid-19 Virus Infection Educated patient on the newly broadened list of signs and symptoms of COVID-19 infection and ways to avoid the viral infection including washing hands frequently with soap and water,  using hand sanitizer if unable to wash, avoiding touching face,  staying at home and limiting visitors,  and avoiding contact with people coming in and out of home.  Discussed the potential ineffectiveness of hand sanitizer if left in environments > 110 degrees (ie , the car).  Reminded patient to call office with questions/concerns.  The importance of continued social distancing was discussed today . Patient was screened for the development of any unsafe behaviors or habits that may have developed as a result of the social impact of the virus , including alcohol abuse,  Domestic violence, tobacco abuse and overeating.     B12 deficiency Intrinsic factor negative.  Taking sublingual supplements.  Repeat level ordered  Cervical high risk human papillomavirus (HPV) DNA test positive She is overdue for Pap smear and will return in 6 months for follow-up.    I discussed the assessment and treatment plan with the patient. The patient was provided an opportunity to ask questions and all were answered. The patient agreed with the plan and demonstrated an understanding of the instructions.   The patient was advised to call back or seek an in-person evaluation if the symptoms worsen or if  the condition fails to improve as anticipated.  I provided 25 minutes of non-face-to-face time during this encounter.   Crecencio Mc, MD

## 2019-03-14 ENCOUNTER — Encounter: Payer: Self-pay | Admitting: Internal Medicine

## 2019-03-14 ENCOUNTER — Telehealth: Payer: Self-pay

## 2019-03-14 DIAGNOSIS — Z800 Family history of malignant neoplasm of digestive organs: Secondary | ICD-10-CM | POA: Insufficient documentation

## 2019-03-14 DIAGNOSIS — E669 Obesity, unspecified: Secondary | ICD-10-CM

## 2019-03-14 DIAGNOSIS — R635 Abnormal weight gain: Secondary | ICD-10-CM

## 2019-03-14 DIAGNOSIS — E538 Deficiency of other specified B group vitamins: Secondary | ICD-10-CM

## 2019-03-14 DIAGNOSIS — R609 Edema, unspecified: Secondary | ICD-10-CM

## 2019-03-14 DIAGNOSIS — I10 Essential (primary) hypertension: Secondary | ICD-10-CM

## 2019-03-14 DIAGNOSIS — Z7189 Other specified counseling: Secondary | ICD-10-CM | POA: Insufficient documentation

## 2019-03-14 NOTE — Assessment & Plan Note (Signed)
Has not had BP checked in nearly a year.  Medication refilled and patient advised that without a few home checks and lab visit to check lytes/cr the medication cannot be refilled.

## 2019-03-14 NOTE — Assessment & Plan Note (Signed)
Intrinsic factor negative.  Taking sublingual supplements.  Repeat level ordered

## 2019-03-14 NOTE — Assessment & Plan Note (Signed)
She is overdue for Pap smear and will return in 6 months for follow-up.

## 2019-03-14 NOTE — Telephone Encounter (Signed)
What labs are needed for this pt?

## 2019-03-14 NOTE — Assessment & Plan Note (Addendum)
I have addressed  BMI and recommended wt loss of 10% of body weight over the next 6 months using a low fat, fruit/vegetable based Mediteranean diet and regular exercise a minimum of 5 days per week.   

## 2019-03-14 NOTE — Assessment & Plan Note (Signed)
Educated patient on the newly broadened list of signs and symptoms of COVID-19 infection and ways to avoid the viral infection including washing hands frequently with soap and water,  using hand sanitizer if unable to wash, avoiding touching face,  staying at home and limiting visitors,  and avoiding contact with people coming in and out of home.  Discussed the potential ineffectiveness of hand sanitizer if left in environments > 110 degrees (ie , the car).  Reminded patient to call office with questions/concerns.  The importance of continued social distancing was discussed today . Patient was screened for the development of any unsafe behaviors or habits that may have developed as a result of the social impact of the virus , including alcohol abuse,  Domestic violence, tobacco abuse and overeating.

## 2019-03-14 NOTE — Telephone Encounter (Signed)
Copied from [place] [id number]. Topic: Appointment Scheduling - Scheduling Inquiry for Clinic >> Mar 14, 2019 10:22 AM Mathis Bud wrote: Reason for CRM: Patient had virtual with PCP 6/24. Patient states she had to call back to make lab app. There are no future lab orders.  Patient call back # [phone]

## 2019-03-14 NOTE — Assessment & Plan Note (Signed)
Diagnosed at age 43, with recurrent recurrence.  She will start screening at age 65

## 2019-03-15 ENCOUNTER — Ambulatory Visit
Admission: RE | Admit: 2019-03-15 | Discharge: 2019-03-15 | Disposition: A | Discharge status: Home / Self Care | Payer: 59 | Source: Ambulatory Visit | Attending: Internal Medicine | Admitting: Internal Medicine

## 2019-03-15 ENCOUNTER — Other Ambulatory Visit: Payer: Self-pay

## 2019-03-15 DIAGNOSIS — Z1239 Encounter for other screening for malignant neoplasm of breast: Secondary | ICD-10-CM

## 2019-03-15 DIAGNOSIS — Z1231 Encounter for screening mammogram for malignant neoplasm of breast: Secondary | ICD-10-CM | POA: Insufficient documentation

## 2019-03-29 ENCOUNTER — Other Ambulatory Visit: Payer: 59

## 2019-04-01 ENCOUNTER — Other Ambulatory Visit: Payer: Self-pay

## 2019-04-01 ENCOUNTER — Other Ambulatory Visit (INDEPENDENT_AMBULATORY_CARE_PROVIDER_SITE_OTHER): Payer: 59

## 2019-04-01 DIAGNOSIS — R635 Abnormal weight gain: Secondary | ICD-10-CM

## 2019-04-01 DIAGNOSIS — R609 Edema, unspecified: Secondary | ICD-10-CM | POA: Diagnosis not present

## 2019-04-01 DIAGNOSIS — E538 Deficiency of other specified B group vitamins: Secondary | ICD-10-CM | POA: Diagnosis not present

## 2019-04-01 DIAGNOSIS — I10 Essential (primary) hypertension: Secondary | ICD-10-CM

## 2019-04-02 ENCOUNTER — Other Ambulatory Visit: Payer: Self-pay | Admitting: Internal Medicine

## 2019-04-02 LAB — COMPREHENSIVE METABOLIC PANEL
ALT: 12 U/L (ref 0–35)
AST: 13 U/L (ref 0–37)
Albumin: 4.3 g/dL (ref 3.5–5.2)
Alkaline Phosphatase: 57 U/L (ref 39–117)
BUN: 11 mg/dL (ref 6–23)
CO2: 27 mEq/L (ref 19–32)
Calcium: 9.2 mg/dL (ref 8.4–10.5)
Chloride: 102 mEq/L (ref 96–112)
Creatinine, Ser: 0.74 mg/dL (ref 0.40–1.20)
GFR: 85.19 mL/min (ref >60.00)
Glucose, Bld: 113 mg/dL — ABNORMAL HIGH (ref 70–99)
Potassium: 3.8 mEq/L (ref 3.5–5.1)
Sodium: 137 mEq/L (ref 135–145)
Total Bilirubin: 0.5 mg/dL (ref 0.2–1.2)
Total Protein: 7 g/dL (ref 6.0–8.3)

## 2019-04-02 LAB — CBC WITH DIFFERENTIAL/PLATELET
Basophils Absolute: 0.1 10*3/uL (ref 0.0–0.1)
Basophils Relative: 0.8 % (ref 0.0–3.0)
Eosinophils Absolute: 0.2 10*3/uL (ref 0.0–0.7)
Eosinophils Relative: 1.8 % (ref 0.0–5.0)
HCT: 34.2 % — ABNORMAL LOW (ref 36.0–46.0)
Hemoglobin: 11.4 g/dL — ABNORMAL LOW (ref 12.0–15.0)
Lymphocytes Relative: 20.5 % (ref 12.0–46.0)
Lymphs Abs: 1.9 10*3/uL (ref 0.7–4.0)
MCHC: 33.2 g/dL (ref 30.0–36.0)
MCV: 82.5 fl (ref 78.0–100.0)
Monocytes Absolute: 0.6 10*3/uL (ref 0.1–1.0)
Monocytes Relative: 6.6 % (ref 3.0–12.0)
Neutro Abs: 6.5 10*3/uL (ref 1.4–7.7)
Neutrophils Relative %: 70.3 % (ref 43.0–77.0)
Platelets: 325 10*3/uL (ref 150.0–400.0)
RBC: 4.15 Mil/uL (ref 3.87–5.11)
RDW: 14.2 % (ref 11.5–15.5)
WBC: 9.2 10*3/uL (ref 4.0–10.5)

## 2019-04-02 LAB — TSH: TSH: 1.41 u[IU]/mL (ref 0.35–4.50)

## 2019-04-02 LAB — LIPID PANEL
Cholesterol: 189 mg/dL (ref 0–200)
HDL: 42.2 mg/dL (ref >39.00)
LDL Cholesterol: 108 mg/dL — ABNORMAL HIGH (ref 0–99)
NonHDL: 146.38
Total CHOL/HDL Ratio: 4
Triglycerides: 192 mg/dL — ABNORMAL HIGH (ref 0.0–149.0)
VLDL: 38.4 mg/dL (ref 0.0–40.0)

## 2019-04-02 LAB — VITAMIN B12: Vitamin B-12: 267 pg/mL (ref 211–911)

## 2019-04-02 MED ORDER — LOSARTAN POTASSIUM-HCTZ 50-12.5 MG PO TABS: 1.0000 | ORAL_TABLET | Freq: Every day | ORAL | 1 refills | Status: DC

## 2019-04-04 ENCOUNTER — Other Ambulatory Visit: Payer: Self-pay | Admitting: Internal Medicine

## 2019-04-04 DIAGNOSIS — E538 Deficiency of other specified B group vitamins: Secondary | ICD-10-CM

## 2019-04-04 DIAGNOSIS — R7301 Impaired fasting glucose: Secondary | ICD-10-CM

## 2019-04-04 DIAGNOSIS — D649 Anemia, unspecified: Secondary | ICD-10-CM

## 2019-05-01 ENCOUNTER — Encounter: Payer: 59 | Admitting: Internal Medicine

## 2019-05-07 ENCOUNTER — Other Ambulatory Visit: Payer: Self-pay

## 2019-05-09 ENCOUNTER — Other Ambulatory Visit (HOSPITAL_COMMUNITY)
Admission: RE | Admit: 2019-05-09 | Discharge: 2019-05-09 | Disposition: A | Discharge status: Home / Self Care | Payer: 59 | Source: Ambulatory Visit | Attending: Internal Medicine | Admitting: Internal Medicine

## 2019-05-09 ENCOUNTER — Ambulatory Visit (INDEPENDENT_AMBULATORY_CARE_PROVIDER_SITE_OTHER): Payer: 59 | Admitting: Internal Medicine

## 2019-05-09 ENCOUNTER — Other Ambulatory Visit: Payer: Self-pay

## 2019-05-09 ENCOUNTER — Encounter: Payer: Self-pay | Admitting: Internal Medicine

## 2019-05-09 VITALS — BP 108/76 | HR 92 | Temp 97.7°F | Resp 14 | Ht 63.0 in | Wt 199.6 lb

## 2019-05-09 DIAGNOSIS — Z23 Encounter for immunization: Secondary | ICD-10-CM | POA: Diagnosis not present

## 2019-05-09 DIAGNOSIS — I10 Essential (primary) hypertension: Secondary | ICD-10-CM

## 2019-05-09 DIAGNOSIS — E538 Deficiency of other specified B group vitamins: Secondary | ICD-10-CM

## 2019-05-09 DIAGNOSIS — Z0000 Encounter for general adult medical examination without abnormal findings: Secondary | ICD-10-CM | POA: Diagnosis not present

## 2019-05-09 DIAGNOSIS — Z114 Encounter for screening for human immunodeficiency virus [HIV]: Secondary | ICD-10-CM | POA: Diagnosis not present

## 2019-05-09 DIAGNOSIS — Z124 Encounter for screening for malignant neoplasm of cervix: Secondary | ICD-10-CM | POA: Diagnosis present

## 2019-05-09 DIAGNOSIS — E669 Obesity, unspecified: Secondary | ICD-10-CM

## 2019-05-09 DIAGNOSIS — R7301 Impaired fasting glucose: Secondary | ICD-10-CM | POA: Diagnosis not present

## 2019-05-09 LAB — HEMOGLOBIN A1C: Hgb A1c MFr Bld: 5.9 % (ref 4.6–6.5)

## 2019-05-09 MED ORDER — LOSARTAN POTASSIUM 50 MG PO TABS: 50.0000 mg | ORAL_TABLET | Freq: Every day | ORAL | 3 refills | Status: DC

## 2019-05-09 MED ORDER — FLUTICASONE PROPIONATE 50 MCG/ACT NA SUSP: 2.0000 | Freq: Every day | NASAL | 2 refills | Status: DC

## 2019-05-09 NOTE — Progress Notes (Signed)
Patient ID: Nancy Leon, female    DOB: 10-16-1975  Age: 43 y.o. MRN: [id number]  The patient is here for annual comprehensive  examination and management of other chronic and acute problems.   PAP SMEAR DUE  The risk factors are reflected in the social history.  The roster of all physicians providing medical care to patient - is listed in the Snapshot section of the chart.  Activities of daily living:  The patient is 100% independent in all ADLs: dressing, toileting, feeding as well as independent mobility  Home safety : The patient has smoke detectors in the home. They wear seatbelts.  There are no firearms at home. There is no violence in the home.   There is no risks for hepatitis, STDs or HIV. There is no   history of blood transfusion. They have no travel history to infectious disease endemic areas of the world.  The patient has seen their dentist in the last six month. They have seen their eye doctor in the last year.. They have deferred audiologic testing in the last year.  They do not  have excessive sun exposure. Discussed the need for sun protection: hats, long sleeves and use of sunscreen if there is significant sun exposure.   Diet: the importance of a healthy diet is discussed. They do have a healthy diet.  The benefits of regular aerobic exercise were discussed. She walks 4 times per week ,  20 minutes.   Depression screen: there are no signs or vegative symptoms of depression- irritability, change in appetite, anhedonia, sadness/tearfullness.  Cognitive assessment: the patient manages all their financial and personal affairs and is actively engaged. They could relate day,date,year and events; recalled 2/3 objects at 3 minutes; performed clock-face test normally.  The following portions of the patient's history were reviewed and updated as appropriate: allergies, current medications, past family history, past medical history,  past surgical history, past social  history  and problem list.  Visual acuity was not assessed per patient preference since she has regular follow up with her ophthalmologist. Hearing and body mass index were assessed and reviewed.   During the course of the visit the patient was educated and counseled about appropriate screening and preventive services including : fall prevention , diabetes screening, nutrition counseling, colorectal cancer screening, and recommended immunizations.    CC: The primary encounter diagnosis was Screening for HIV without presence of risk factors. Diagnoses of B12 deficiency, Impaired fasting blood sugar, Need for immunization against influenza, Cervical cancer screening, Obesity (BMI 30.0-34.9), Essential hypertension, benign, and Encounter for preventive health examination were also pertinent to this visit.  B12 DEFICIENCY:  RECURRENT , feeling better with sublingual supplements started one month ago.  Not totally vegetarian anymore so cause of drop unclear .  Rechecking instrinsic factor.   Obesity:  She reports that she lost  17 LBS THEN GAINED 5 DURING COVID.  Currently exercising using the ellipitical ,  AND INTERMITTENT FASTING   MATERNAL HISTORY OF COLONIC CA  AT  AGE 15, WITH  RECURRENCE , BUT  DOING WELL .   History Nancy Leon has a past medical history of Allergy, Depression, Hypertension, and Premenstrual dysphoric disorder.   She has a past surgical history that includes Tubal ligation and Cesarean section (2011).   Her family history includes COPD in her father; Cancer (age of onset: 30) in her mother; Diabetes in her mother; Hyperlipidemia in her mother; Hypertension in her father.She reports that she has never smoked. She has never  used smokeless tobacco. She reports current alcohol use. She reports that she does not use drugs.  Outpatient Medications Prior to Visit  Medication Sig Dispense Refill  . fluticasone (FLONASE) 50 MCG/ACT nasal spray Place 2 sprays into both nostrils daily.  16 g 2  . losartan-hydrochlorothiazide (HYZAAR) 50-12.5 MG tablet Take 1 tablet by mouth daily. 90 tablet 1  . loratadine (CLARITIN) 10 MG tablet Take 10 mg by mouth daily.     No facility-administered medications prior to visit.     Review of Systems   Patient denies headache, fevers, malaise, unintentional weight loss, skin rash, eye pain, sinus congestion and sinus pain, sore throat, dysphagia,  hemoptysis , cough, dyspnea, wheezing, chest pain, palpitations, orthopnea, edema, abdominal pain, nausea, melena, diarrhea, constipation, flank pain, dysuria, hematuria, urinary  Frequency, nocturia, numbness, tingling, seizures,  Focal weakness, Loss of consciousness,  Tremor, insomnia, depression, anxiety, and suicidal ideation.      Objective:  BP 108/76 (BP Location: Left Arm, Patient Position: Sitting, Cuff Size: Large)   Pulse 92   Temp 97.7 F (36.5 C) (Oral)   Resp 14   Ht 5\' 3"  (1.6 m)   Wt 199 lb 9.6 oz (90.5 kg)   SpO2 97%   BMI 35.36 kg/m   Physical Exam   General Appearance:    Alert, cooperative, no distress, appears stated age  Head:    Normocephalic, without obvious abnormality, atraumatic  Eyes:    PERRL, conjunctiva/corneas clear, EOM's intact, fundi    benign, both eyes  Ears:    Normal TM's and external ear canals, both ears  Nose:   Nares normal, septum midline, mucosa normal, no drainage    or sinus tenderness  Throat:   Lips, mucosa, and tongue normal; teeth and gums normal  Neck:   Supple, symmetrical, trachea midline, no adenopathy;    thyroid:  no enlargement/tenderness/nodules; no carotid   bruit or JVD  Back:     Symmetric, no curvature, ROM normal, no CVA tenderness  Lungs:     Clear to auscultation bilaterally, respirations unlabored  Chest Wall:    No tenderness or deformity   Heart:    Regular rate and rhythm, S1 and S2 normal, no murmur, rub   or gallop  Breast Exam:    No tenderness, masses, or nipple abnormality  Abdomen:     Soft, non-tender,  bowel sounds active all four quadrants,    no masses, no organomegaly  Genitalia:    Pelvic: cervix normal in appearance, external genitalia normal, no adnexal masses or tenderness, no cervical motion tenderness, rectovaginal septum normal, uterus normal size, shape, and consistency and vagina normal without discharge  Extremities:   Extremities normal, atraumatic, no cyanosis or edema  Pulses:   2+ and symmetric all extremities  Skin:   Skin color, texture, turgor normal, no rashes or lesions  Lymph nodes:   Cervical, supraclavicular, and axillary nodes normal  Neurologic:   CNII-XII intact, normal strength, sensation and reflexes    throughout      Assessment & Plan:   Problem List Items Addressed This Visit      Unprioritized   Obesity (BMI 30.0-34.9)    I have addressed  BMI and recommended a low glycemic index diet utilizing smaller more frequent meals to increase metabolism.  I have also recommended that patient start exercising with a goal of 30 minutes of aerobic exercise a minimum of 5 days per week. Screening for lipid disorders, thyroid and diabetes to be done  today.        Essential hypertension, benign    Well controlled on current regimen. Renal function stable, no changes today.      Relevant Medications   losartan (COZAAR) 50 MG tablet   Encounter for preventive health examination    age appropriate education and counseling updated, referrals for preventative services and immunizations addressed, dietary and smoking counseling addressed, most recent labs reviewed.  I have personally reviewed and have noted:  1) the patient's medical and social history 2) The pt's use of alcohol, tobacco, and illicit drugs 3) The patient's current medications and supplements 4) Functional ability including ADL's, fall risk, home safety risk, hearing and visual impairment 5) Diet and physical activities 6) Evidence for depression or mood disorder 7) The patient's height, weight,  and BMI have been recorded in the chart  I have made referrals, and provided counseling and education based on review of the above      B12 deficiency    Intrinsic factor negative in the past,  But she has become low again while   Taking sublingual supplements.  Repeat level ordered      Relevant Orders   RBC Folate (Completed)   Intrinsic Factor Antibodies (Completed)    Other Visit Diagnoses    Screening for HIV without presence of risk factors    -  Primary   Relevant Orders   HIV Antibody (routine testing w rflx) (Completed)   Impaired fasting blood sugar       Relevant Orders   Hemoglobin A1c (Completed)   Need for immunization against influenza       Relevant Orders   Flu Vaccine QUAD 36+ mos IM (Completed)   Cervical cancer screening       Relevant Orders   Cytology - PAP( [place]) (Completed)      I have discontinued Tessie Fass "Beth"'s loratadine and losartan-hydrochlorothiazide. I am also having her start on losartan. Additionally, I am having her maintain her fluticasone.  Meds ordered this encounter  Medications  . losartan (COZAAR) 50 MG tablet    Sig: Take 1 tablet (50 mg total) by mouth daily.    Dispense:  90 tablet    Refill:  3  . fluticasone (FLONASE) 50 MCG/ACT nasal spray    Sig: Place 2 sprays into both nostrils daily.    Dispense:  16 g    Refill:  2    Medications Discontinued During This Encounter  Medication Reason  . loratadine (CLARITIN) 10 MG tablet Patient has not taken in last 30 days  . losartan-hydrochlorothiazide (HYZAAR) 50-12.5 MG tablet   . fluticasone (FLONASE) 50 MCG/ACT nasal spray Reorder    Follow-up: No follow-ups on file.   Crecencio Mc, MD

## 2019-05-09 NOTE — Patient Instructions (Signed)
Health Maintenance, Female Adopting a healthy lifestyle and getting preventive care are important in promoting health and wellness. Ask your health care provider about:  The right schedule for you to have regular tests and exams.  Things you can do on your own to prevent diseases and keep yourself healthy. What should I know about diet, weight, and exercise? Eat a healthy diet   Eat a diet that includes plenty of vegetables, fruits, low-fat dairy products, and lean protein.  Do not eat a lot of foods that are high in solid fats, added sugars, or sodium. Maintain a healthy weight Body mass index (BMI) is used to identify weight problems. It estimates body fat based on height and weight. Your health care provider can help determine your BMI and help you achieve or maintain a healthy weight. Get regular exercise Get regular exercise. This is one of the most important things you can do for your health. Most adults should:  Exercise for at least 150 minutes each week. The exercise should increase your heart rate and make you sweat (moderate-intensity exercise).  Do strengthening exercises at least twice a week. This is in addition to the moderate-intensity exercise.  Spend less time sitting. Even light physical activity can be beneficial. Watch cholesterol and blood lipids Have your blood tested for lipids and cholesterol at 44 years of age, then have this test every 5 years. Have your cholesterol levels checked more often if:  Your lipid or cholesterol levels are high.  You are older than 44 years of age.  You are at high risk for heart disease. What should I know about cancer screening? Depending on your health history and family history, you may need to have cancer screening at various ages. This may include screening for:  Breast cancer.  Cervical cancer.  Colorectal cancer.  Skin cancer.  Lung cancer. What should I know about heart disease, diabetes, and high blood  pressure? Blood pressure and heart disease  High blood pressure causes heart disease and increases the risk of stroke. This is more likely to develop in people who have high blood pressure readings, are of African descent, or are overweight.  Have your blood pressure checked: ? Every 3-5 years if you are 18-39 years of age. ? Every year if you are 40 years old or older. Diabetes Have regular diabetes screenings. This checks your fasting blood sugar level. Have the screening done:  Once every three years after age 40 if you are at a normal weight and have a low risk for diabetes.  More often and at a younger age if you are overweight or have a high risk for diabetes. What should I know about preventing infection? Hepatitis B If you have a higher risk for hepatitis B, you should be screened for this virus. Talk with your health care provider to find out if you are at risk for hepatitis B infection. Hepatitis C Testing is recommended for:  Everyone born from 1945 through 1965.  Anyone with known risk factors for hepatitis C. Sexually transmitted infections (STIs)  Get screened for STIs, including gonorrhea and chlamydia, if: ? You are sexually active and are younger than 44 years of age. ? You are older than 44 years of age and your health care provider tells you that you are at risk for this type of infection. ? Your sexual activity has changed since you were last screened, and you are at increased risk for chlamydia or gonorrhea. Ask your health care provider if   you are at risk.  Ask your health care provider about whether you are at high risk for HIV. Your health care provider may recommend a prescription medicine to help prevent HIV infection. If you choose to take medicine to prevent HIV, you should first get tested for HIV. You should then be tested every 3 months for as long as you are taking the medicine. Pregnancy  If you are about to stop having your period (premenopausal) and  you may become pregnant, seek counseling before you get pregnant.  Take 400 to 800 micrograms (mcg) of folic acid every day if you become pregnant.  Ask for birth control (contraception) if you want to prevent pregnancy. Osteoporosis and menopause Osteoporosis is a disease in which the bones lose minerals and strength with aging. This can result in bone fractures. If you are 65 years old or older, or if you are at risk for osteoporosis and fractures, ask your health care provider if you should:  Be screened for bone loss.  Take a calcium or vitamin D supplement to lower your risk of fractures.  Be given hormone replacement therapy (HRT) to treat symptoms of menopause. Follow these instructions at home: Lifestyle  Do not use any products that contain nicotine or tobacco, such as cigarettes, e-cigarettes, and chewing tobacco. If you need help quitting, ask your health care provider.  Do not use street drugs.  Do not share needles.  Ask your health care provider for help if you need support or information about quitting drugs. Alcohol use  Do not drink alcohol if: ? Your health care provider tells you not to drink. ? You are pregnant, may be pregnant, or are planning to become pregnant.  If you drink alcohol: ? Limit how much you use to 0-1 drink a day. ? Limit intake if you are breastfeeding.  Be aware of how much alcohol is in your drink. In the [place] one drink equals one 12 oz bottle of beer (355 mL), one 5 oz glass of wine (148 mL), or one 1 oz glass of hard liquor (44 mL). General instructions  Schedule regular health, dental, and eye exams.  Stay current with your vaccines.  Tell your health care provider if: ? You often feel depressed. ? You have ever been abused or do not feel safe at home. Summary  Adopting a healthy lifestyle and getting preventive care are important in promoting health and wellness.  Follow your health care provider's instructions about healthy  diet, exercising, and getting tested or screened for diseases.  Follow your health care provider's instructions on monitoring your cholesterol and blood pressure. This information is not intended to replace advice given to you by your health care provider. Make sure you discuss any questions you have with your health care provider. Document Released: 03/21/2011 Document Revised: 08/29/2018 Document Reviewed: 08/29/2018 Elsevier Patient Education  2020 [employer]  

## 2019-05-10 LAB — CYTOLOGY - PAP
Diagnosis: NEGATIVE
HPV: NOT DETECTED

## 2019-05-11 ENCOUNTER — Encounter: Payer: Self-pay | Admitting: Internal Medicine

## 2019-05-11 LAB — FOLATE RBC: RBC Folate: 809 ng/mL RBC (ref >280)

## 2019-05-11 LAB — INTRINSIC FACTOR ANTIBODIES: Intrinsic Factor: NEGATIVE

## 2019-05-11 LAB — HIV ANTIBODY (ROUTINE TESTING W REFLEX): HIV 1&2 Ab, 4th Generation: NONREACTIVE

## 2019-05-11 NOTE — Assessment & Plan Note (Signed)
Intrinsic factor negative in the past,  But she has become low again while   Taking sublingual supplements.  Repeat level ordered

## 2019-05-11 NOTE — Assessment & Plan Note (Signed)

## 2019-05-11 NOTE — Assessment & Plan Note (Signed)
I have addressed  BMI and recommended a low glycemic index diet utilizing smaller more frequent meals to increase metabolism.  I have also recommended that patient start exercising with a goal of 30 minutes of aerobic exercise a minimum of 5 days per week. Screening for lipid disorders, thyroid and diabetes to be done today.   

## 2019-05-11 NOTE — Assessment & Plan Note (Signed)
Well controlled on current regimen. Renal function stable, no changes today. 

## 2019-08-29 ENCOUNTER — Other Ambulatory Visit (INDEPENDENT_AMBULATORY_CARE_PROVIDER_SITE_OTHER): Payer: 59

## 2019-08-29 ENCOUNTER — Other Ambulatory Visit: Payer: Self-pay

## 2019-08-29 ENCOUNTER — Telehealth: Payer: Self-pay | Admitting: Internal Medicine

## 2019-08-29 DIAGNOSIS — R300 Dysuria: Secondary | ICD-10-CM

## 2019-08-29 LAB — URINALYSIS, MICROSCOPIC ONLY: RBC / HPF: NONE SEEN (ref 0–?)

## 2019-08-29 NOTE — Telephone Encounter (Signed)
PT has a possible UTI, scheduled a virtual for this 08/30/2019 at 8:30.

## 2019-08-29 NOTE — Telephone Encounter (Signed)
Spoke with pt this morning to see if she could come in today to give [place] a urine sample. Pt is scheduled for lab and orders have been placed.

## 2019-08-30 ENCOUNTER — Encounter: Payer: Self-pay | Admitting: Internal Medicine

## 2019-08-30 ENCOUNTER — Other Ambulatory Visit (INDEPENDENT_AMBULATORY_CARE_PROVIDER_SITE_OTHER): Payer: 59

## 2019-08-30 ENCOUNTER — Ambulatory Visit (INDEPENDENT_AMBULATORY_CARE_PROVIDER_SITE_OTHER): Payer: 59 | Admitting: Internal Medicine

## 2019-08-30 DIAGNOSIS — R300 Dysuria: Secondary | ICD-10-CM

## 2019-08-30 DIAGNOSIS — N3090 Cystitis, unspecified without hematuria: Secondary | ICD-10-CM | POA: Diagnosis not present

## 2019-08-30 LAB — URINALYSIS
Bilirubin Urine: NEGATIVE
Hgb urine dipstick: NEGATIVE
Ketones, ur: NEGATIVE
Leukocytes,Ua: NEGATIVE
Nitrite: NEGATIVE
Specific Gravity, Urine: <=1.005 — AB (ref 1.000–1.030)
Total Protein, Urine: NEGATIVE
Urine Glucose: NEGATIVE
Urobilinogen, UA: 0.2 (ref 0.0–1.0)
pH: 6.5 (ref 5.0–8.0)

## 2019-08-30 NOTE — Addendum Note (Signed)
Addended by: Zannie Cove on: 08/30/2019 03:22 PM   Modules accepted: Orders

## 2019-08-30 NOTE — Addendum Note (Signed)
Addended by: Zannie Cove on: 08/30/2019 08:58 AM   Modules accepted: Orders

## 2019-08-30 NOTE — Progress Notes (Addendum)
. Virtual Visit converted to telephone   This visit type was conductedd due to national recommendations for restrictions regarding the COVID-19 pandemic (e.g. social distancing).  This format is felt to be most appropriate for this patient at this time.  All issues noted in this document were discussed and addressed.  No physical exam was performed (except for noted visual exam findings with Video Visits).   I connected with@ on 08/30/19 at  8:30 AM EST by a video enabled telemedicine application  and verified that I am speaking with the correct person using two identifiers. Interactive audio and video telecommunications were initially established beteen this provider and patient, however ultimately failed, due to patient having technical difficulties. We continued and completed visit with audio only  and verified that I am speaking with the correct person using two identifiers Location patient: home Location provider: work or home office Persons participating in the virtual visit: patient, provider  I discussed the limitations, risks, security and privacy concerns of performing an evaluation and management service by telephone and the availability of in person appointments. I also discussed with the patient that there may be a patient responsible charge related to this service. The patient expressed understanding and agreed to proceed.  Reason for visit: urinary frequency  HPI:  43 yr old female presents with 24 hour history of urinary frequency and mild dysuria .  Denies fevers,  Nausea abdominal and flank pain.  No hematuria .  No recent travel .  Has been more sexually active recently with husband.  Symptoms are currently resolved since she increased her water intake.  Urinalysis was normal   ROS: See pertinent positives and negatives per HPI.  Past Medical History:  Diagnosis Date  . Allergy   . Depression   . Hypertension   . Premenstrual dysphoric disorder     Past Surgical  History:  Procedure Laterality Date  . CESAREAN SECTION  2011  . TUBAL LIGATION      Family History  Problem Relation Age of Onset  . Diabetes Mother   . Hyperlipidemia Mother   . Cancer Mother 7       colon ca  . Hypertension Father   . COPD Father   . Breast cancer Neg Hx     SOCIAL HX:  reports that she has never smoked. She has never used smokeless tobacco. She reports current alcohol use. She reports that she does not use drugs.   Current Outpatient Medications:  .  fluticasone (FLONASE) 50 MCG/ACT nasal spray, Place 2 sprays into both nostrils daily., Disp: 16 g, Rfl: 2 .  losartan-hydrochlorothiazide (HYZAAR) 50-12.5 MG tablet, Take 1 tablet by mouth daily., Disp: , Rfl:   EXAM: done prior to video fail  VITALS per patient if applicable:  GENERAL: alert, oriented, appears well and in no acute distress  HEENT: atraumatic, conjunttiva clear, no obvious abnormalities on inspection of external nose and ears  NECK: normal movements of the head and neck  LUNGS: on inspection no signs of respiratory distress, breathing rate appears normal, no obvious gross SOB, gasping or wheezing  CV: no obvious cyanosis  MS: moves all visible extremities without noticeable abnormality  PSYCH/NEURO: pleasant and cooperative, no obvious depression or anxiety, speech and thought processing grossly intact  ASSESSMENT AND PLAN:  Discussed the following assessment and plan:  Cystitis  Cystitis Symptoms resolved with increased water intake  and there is no sign of bacterial infection  By AU, micro and culture.  I discussed the assessment and treatment plan with the patient. The patient was provided an opportunity to ask questions and all were answered. The patient agreed with the plan and demonstrated an understanding of the instructions.   The patient was advised to call back or seek an in-person evaluation if the symptoms worsen or if the condition fails to improve as  anticipated.  I provided 15 minutes of non-face-to-face time during this encounter.   Crecencio Mc, MD

## 2019-08-30 NOTE — Addendum Note (Signed)
Addended by: Zannie Cove on: 08/30/2019 02:58 PM   Modules accepted: Orders

## 2019-08-31 LAB — URINE CULTURE
MICRO NUMBER:: 1185022
Result:: NO GROWTH
SPECIMEN QUALITY:: ADEQUATE

## 2019-09-01 NOTE — Assessment & Plan Note (Signed)
Symptoms resolved with increased water intake  and there is no sign of bacterial infection  By AU, micro and culture.

## 2019-09-17 ENCOUNTER — Ambulatory Visit: Payer: 59 | Attending: Internal Medicine

## 2019-09-17 DIAGNOSIS — Z20822 Contact with and (suspected) exposure to covid-19: Secondary | ICD-10-CM

## 2019-09-18 LAB — NOVEL CORONAVIRUS, NAA: SARS-CoV-2, NAA: NOT DETECTED

## 2019-10-14 ENCOUNTER — Other Ambulatory Visit: Payer: Self-pay

## 2019-10-14 MED ORDER — FLUTICASONE PROPIONATE 50 MCG/ACT NA SUSP: 2.0000 | Freq: Every day | NASAL | 2 refills | Status: DC

## 2020-04-28 ENCOUNTER — Other Ambulatory Visit: Payer: Self-pay | Admitting: Internal Medicine

## 2020-04-28 DIAGNOSIS — Z1231 Encounter for screening mammogram for malignant neoplasm of breast: Secondary | ICD-10-CM

## 2020-05-19 ENCOUNTER — Other Ambulatory Visit: Payer: Self-pay

## 2020-05-19 ENCOUNTER — Ambulatory Visit
Admission: RE | Admit: 2020-05-19 | Discharge: 2020-05-19 | Disposition: A | Discharge status: Home / Self Care | Payer: 59 | Source: Ambulatory Visit | Attending: Internal Medicine | Admitting: Internal Medicine

## 2020-05-19 DIAGNOSIS — Z1231 Encounter for screening mammogram for malignant neoplasm of breast: Secondary | ICD-10-CM | POA: Insufficient documentation

## 2020-07-22 ENCOUNTER — Encounter: Payer: Self-pay | Admitting: Dermatology

## 2020-07-22 ENCOUNTER — Other Ambulatory Visit: Payer: Self-pay

## 2020-07-22 ENCOUNTER — Ambulatory Visit (INDEPENDENT_AMBULATORY_CARE_PROVIDER_SITE_OTHER): Payer: 59 | Admitting: Dermatology

## 2020-07-22 DIAGNOSIS — L821 Other seborrheic keratosis: Secondary | ICD-10-CM

## 2020-07-22 DIAGNOSIS — D485 Neoplasm of uncertain behavior of skin: Secondary | ICD-10-CM | POA: Diagnosis not present

## 2020-07-22 DIAGNOSIS — D2339 Other benign neoplasm of skin of other parts of face: Secondary | ICD-10-CM | POA: Diagnosis not present

## 2020-07-22 DIAGNOSIS — D2371 Other benign neoplasm of skin of right lower limb, including hip: Secondary | ICD-10-CM | POA: Diagnosis not present

## 2020-07-22 DIAGNOSIS — L578 Other skin changes due to chronic exposure to nonionizing radiation: Secondary | ICD-10-CM

## 2020-07-22 DIAGNOSIS — D1800 Hemangioma unspecified site: Secondary | ICD-10-CM

## 2020-07-22 DIAGNOSIS — D2271 Melanocytic nevi of right lower limb, including hip: Secondary | ICD-10-CM

## 2020-07-22 DIAGNOSIS — D239 Other benign neoplasm of skin, unspecified: Secondary | ICD-10-CM

## 2020-07-22 DIAGNOSIS — L814 Other melanin hyperpigmentation: Secondary | ICD-10-CM

## 2020-07-22 DIAGNOSIS — D229 Melanocytic nevi, unspecified: Secondary | ICD-10-CM

## 2020-07-22 DIAGNOSIS — Z1283 Encounter for screening for malignant neoplasm of skin: Secondary | ICD-10-CM | POA: Diagnosis not present

## 2020-07-22 DIAGNOSIS — D369 Benign neoplasm, unspecified site: Secondary | ICD-10-CM

## 2020-07-22 NOTE — Patient Instructions (Addendum)
Melanoma ABCDEs  Melanoma is the most dangerous type of skin cancer, and is the leading cause of death from skin disease.  You are more likely to develop melanoma if you:  Have light-colored skin, light-colored eyes, or red or blond hair  Spend a lot of time in the sun  Tan regularly, either outdoors or in a tanning bed  Have had blistering sunburns, especially during childhood  Have a close family member who has had a melanoma  Have atypical moles or large birthmarks  Early detection of melanoma is key since treatment is typically straightforward and cure rates are extremely high if we catch it early.   The first sign of melanoma is often a change in a mole or a new dark spot.  The ABCDE system is a way of remembering the signs of melanoma.  A for asymmetry:  The two halves do not match. B for border:  The edges of the growth are irregular. C for color:  A mixture of colors are present instead of an even brown color. D for diameter:  Melanomas are usually (but not always) greater than 6mm - the size of a pencil eraser. E for evolution:  The spot keeps changing in size, shape,   and color.  Please check your skin once per month between visits. You can use a small mirror in front and a large mirror behind you to keep an eye on the back side or your body.   If you see any new or changing lesions before your next follow-up, please call to schedule a visit.  Please continue daily skin protection including broad spectrum sunscreen SPF 30+ to sun-exposed areas, reapplying every 2 hours as needed when you're outdoors.      Recommend taking Heliocare sun protection supplement daily in sunny weather for additional sun protection. For maximum protection on the sunniest days, you can take up to 2 capsules of regular Heliocare OR take 1 capsule of Heliocare Ultra. For prolonged exposure (such as a full day in the sun), you can repeat your dose of the supplement 4 hours after your first  dose. Heliocare can be purchased at [facility] or at [email or web address].      Wound Care Instructions  1. Cleanse wound gently with soap and water once a day then pat dry with clean gauze. Apply a thing coat of Petrolatum (petroleum jelly, "Vaseline") over the wound (unless you have an allergy to this). We recommend that you use a new, sterile tube of Vaseline. Do not pick or remove scabs. Do not remove the yellow or white "healing tissue" from the base of the wound.  2. Cover the wound with fresh, clean, nonstick gauze and secure with paper tape. You may use Band-Aids in place of gauze and tape if the would is small enough, but would recommend trimming much of the tape off as there is often too much. Sometimes Band-Aids can irritate the skin.  3. You should call the office for your biopsy report after 1 week if you have not already been contacted.  4. If you experience any problems, such as abnormal amounts of bleeding, swelling, significant bruising, significant pain, or evidence of infection, please call the office immediately.  5. FOR ADULT SURGERY PATIENTS: If you need something for pain relief you may take 1 extra strength Tylenol (acetaminophen) AND 2 Ibuprofen (200mg each) together every 4 hours as needed for pain. (do not take these if you are allergic to them or if you have   a reason you should not take them.) Typically, you may only need pain medication for 1 to 3 days.    .  

## 2020-07-22 NOTE — Progress Notes (Signed)
New Patient Visit  Subjective  Nancy Leon is a 44 y.o. female who presents for the following: New Patient Skin Cancer Screening.  Patient with no personal or family history of skin cancer. Patient here for full body skin exam and skin cancer screening.  She notes a pink spot at the chest present for a little while. It is asymptomatic.   The following portions of the chart were reviewed this encounter and updated as appropriate:  Tobacco  Allergies  Meds  Problems  Med Hx  Surg Hx  Fam Hx      Review of Systems:  No other skin or systemic complaints except as noted in HPI or Assessment and Plan.  Objective  Well appearing patient in no apparent distress; mood and affect are within normal limits.  A full examination was performed including scalp, head, eyes, ears, nose, lips, neck, chest, axillae, abdomen, back, buttocks, bilateral upper extremities, bilateral lower extremities, hands, feet, fingers, toes, fingernails, and toenails. All findings within normal limits unless otherwise noted below.  Objective  Right Medial Thigh: 0.25cm med to light brown thin papule  Images      Objective  Right shin, right thigh, right calf: Firm pink/brown papule nodule with dimple sign.   Objective  Right lateral chest: 0.5cm pink thin papule R/o Lichenoid Keratosis vs Irritated Nevus over BCC or other       Objective  left nasal tip: 0.25cm pink papule   Assessment & Plan  Nevus Right Medial Thigh  Benign-appearing.  Observation.  Call clinic for new or changing lesions.  Recommend daily use of broad spectrum spf 30+ sunscreen to sun-exposed areas.   Recheck on follow up.   Dermatofibroma Right shin, right thigh, right calf  Benign-appearing.  Observation.  Call clinic for new or changing lesions.  Recommend daily use of broad spectrum spf 30+ sunscreen to sun-exposed areas.    Neoplasm of uncertain behavior of skin Right lateral chest  Skin / nail  biopsy Type of biopsy: tangential   Informed consent: discussed and consent obtained   Timeout: patient name, date of birth, surgical site, and procedure verified   Patient was prepped and draped in usual sterile fashion: Area prepped with isopropyl alcohol. Anesthesia: the lesion was anesthetized in a standard fashion   Anesthetic:  1% lidocaine w/ epinephrine 1-100,000 buffered w/ 8.4% NaHCO3 Instrument used: flexible razor blade   Hemostasis achieved with: aluminum chloride   Outcome: patient tolerated procedure well   Post-procedure details: wound care instructions given   Additional details:  Mupirocin and a bandage applied  Specimen 1 - Surgical pathology Differential Diagnosis:  Check Margins: No 0.5cm pink thin papule R/o Lichenoid Keratosis vs Irritated Nevus over BCC or other  Angiofibroma left nasal tip  Benign-appearing.  Observation.  Call clinic for new or changing lesions.  Recommend daily use of broad spectrum spf 30+ sunscreen to sun-exposed areas.     Lentigines - Scattered tan macules - Discussed due to sun exposure - Benign, observe - Call for any changes  Seborrheic Keratoses - Stuck-on, waxy, tan-brown papules and plaques  - Discussed benign etiology and prognosis. - Observe - Call for any changes  Melanocytic Nevi - Tan-brown and/or pink-flesh-colored symmetric macules and papules - Benign appearing on exam today - Observation - Call clinic for new or changing moles - Recommend daily use of broad spectrum spf 30+ sunscreen to sun-exposed areas.   Hemangiomas - Red papules - Discussed benign nature - Observe - Call for  any changes  Actinic Damage - Chronic, secondary to cumulative UV/sun exposure - diffuse scaly erythematous macules with underlying dyspigmentation - Recommend daily broad spectrum sunscreen SPF 30+ to sun-exposed areas, reapply every 2 hours as needed.  - Call for new or changing lesions.  Skin cancer screening performed  today.   Return in about 3 months (around 10/22/2020) for recheck nevus, 1 year TBSE.  Graciella Belton, RMA, am acting as scribe for Forest Gleason, MD .  Documentation: I have reviewed the above documentation for accuracy and completeness, and I agree with the above.  Forest Gleason, MD

## 2020-07-27 NOTE — Progress Notes (Signed)
Skin , right lateral chest LICHENOID KERATOSIS  "Inflamed sun spot" --> No additional treatment is needed

## 2020-11-04 ENCOUNTER — Other Ambulatory Visit: Payer: Self-pay

## 2020-11-04 ENCOUNTER — Encounter: Payer: Self-pay | Admitting: Dermatology

## 2020-11-04 ENCOUNTER — Ambulatory Visit (INDEPENDENT_AMBULATORY_CARE_PROVIDER_SITE_OTHER): Payer: 59 | Admitting: Dermatology

## 2020-11-04 DIAGNOSIS — D2271 Melanocytic nevi of right lower limb, including hip: Secondary | ICD-10-CM | POA: Diagnosis not present

## 2020-11-04 DIAGNOSIS — L578 Other skin changes due to chronic exposure to nonionizing radiation: Secondary | ICD-10-CM

## 2020-11-04 DIAGNOSIS — D229 Melanocytic nevi, unspecified: Secondary | ICD-10-CM

## 2020-11-04 NOTE — Progress Notes (Signed)
   Follow-Up Visit   Subjective  Nancy Leon is a 45 y.o. female who presents for the following: Nevus (Patient here today for 3 month recheck of nevus at right medial thigh. Patient has not noticed any change. ).   The following portions of the chart were reviewed this encounter and updated as appropriate:   Tobacco  Allergies  Meds  Problems  Med Hx  Surg Hx  Fam Hx      Review of Systems:  No other skin or systemic complaints except as noted in HPI or Assessment and Plan.  Objective  Well appearing patient in no apparent distress; mood and affect are within normal limits.  A focused examination was performed including right thigh. Relevant physical exam findings are noted in the Assessment and Plan.  Objective  Right Medial Thigh: 0.25cm med to light brown thin papule   Assessment & Plan  Nevus Right Medial Thigh  Photo compared, stable  Benign-appearing.  Observation.  Call clinic for new or changing lesions.  Recommend daily use of broad spectrum spf 30+ sunscreen to sun-exposed areas.    Actinic Damage - chronic, secondary to cumulative UV radiation exposure/sun exposure over time - diffuse scaly erythematous macules with underlying dyspigmentation - Recommend daily broad spectrum sunscreen SPF 30+ to sun-exposed areas, reapply every 2 hours as needed.  - Call for new or changing lesions. - Recommend taking Heliocare sun protection supplement daily in sunny weather for additional sun protection. For maximum protection on the sunniest days, you can take up to 2 capsules of regular Heliocare OR take 1 capsule of Heliocare Ultra. For prolonged exposure (such as a full day in the sun), you can repeat your dose of the supplement 4 hours after your first dose.   Return for as scheduled for TBSE.  Graciella Belton, RMA, am acting as scribe for Forest Gleason, MD .  Documentation: I have reviewed the above documentation for accuracy and completeness, and I agree  with the above.  Forest Gleason, MD

## 2020-11-04 NOTE — Patient Instructions (Addendum)
Melanoma ABCDEs  Melanoma is the most dangerous type of skin cancer, and is the leading cause of death from skin disease.  You are more likely to develop melanoma if you:  Have light-colored skin, light-colored eyes, or red or blond hair  Spend a lot of time in the sun  Tan regularly, either outdoors or in a tanning bed  Have had blistering sunburns, especially during childhood  Have a close family member who has had a melanoma  Have atypical moles or large birthmarks  Early detection of melanoma is key since treatment is typically straightforward and cure rates are extremely high if we catch it early.   The first sign of melanoma is often a change in a mole or a new dark spot.  The ABCDE system is a way of remembering the signs of melanoma.  A for asymmetry:  The two halves do not match. B for border:  The edges of the growth are irregular. C for color:  A mixture of colors are present instead of an even brown color. D for diameter:  Melanomas are usually (but not always) greater than 35mm - the size of a pencil eraser. E for evolution:  The spot keeps changing in size, shape, and color.  Please check your skin once per month between visits. You can use a small mirror in front and a large mirror behind you to keep an eye on the back side or your body.   If you see any new or changing lesions before your next follow-up, please call to schedule a visit.  Please continue daily skin protection including broad spectrum sunscreen SPF 30+ to sun-exposed areas, reapplying every 2 hours as needed when you're outdoors.    Recommend taking Heliocare sun protection supplement daily in sunny weather for additional sun protection. For maximum protection on the sunniest days, you can take up to 2 capsules of regular Heliocare OR take 1 capsule of Heliocare Ultra. For prolonged exposure (such as a full day in the sun), you can repeat your dose of the supplement 4 hours after your first dose.  Heliocare can be purchased at [facility] or at [email or web address].

## 2021-07-28 ENCOUNTER — Encounter: Payer: 59 | Admitting: Dermatology

## 2021-08-19 ENCOUNTER — Other Ambulatory Visit: Payer: Self-pay

## 2021-08-19 ENCOUNTER — Ambulatory Visit (INDEPENDENT_AMBULATORY_CARE_PROVIDER_SITE_OTHER): Payer: 59 | Admitting: Dermatology

## 2021-08-19 DIAGNOSIS — L821 Other seborrheic keratosis: Secondary | ICD-10-CM

## 2021-08-19 DIAGNOSIS — L918 Other hypertrophic disorders of the skin: Secondary | ICD-10-CM

## 2021-08-19 DIAGNOSIS — D367 Benign neoplasm of other specified sites: Secondary | ICD-10-CM

## 2021-08-19 DIAGNOSIS — D1801 Hemangioma of skin and subcutaneous tissue: Secondary | ICD-10-CM

## 2021-08-19 DIAGNOSIS — D239 Other benign neoplasm of skin, unspecified: Secondary | ICD-10-CM

## 2021-08-19 DIAGNOSIS — Z1283 Encounter for screening for malignant neoplasm of skin: Secondary | ICD-10-CM | POA: Diagnosis not present

## 2021-08-19 DIAGNOSIS — L578 Other skin changes due to chronic exposure to nonionizing radiation: Secondary | ICD-10-CM

## 2021-08-19 DIAGNOSIS — D369 Benign neoplasm, unspecified site: Secondary | ICD-10-CM

## 2021-08-19 DIAGNOSIS — L814 Other melanin hyperpigmentation: Secondary | ICD-10-CM

## 2021-08-19 DIAGNOSIS — D485 Neoplasm of uncertain behavior of skin: Secondary | ICD-10-CM | POA: Diagnosis not present

## 2021-08-19 DIAGNOSIS — D229 Melanocytic nevi, unspecified: Secondary | ICD-10-CM

## 2021-08-19 DIAGNOSIS — D2271 Melanocytic nevi of right lower limb, including hip: Secondary | ICD-10-CM

## 2021-08-19 DIAGNOSIS — D2371 Other benign neoplasm of skin of right lower limb, including hip: Secondary | ICD-10-CM

## 2021-08-19 DIAGNOSIS — D2372 Other benign neoplasm of skin of left lower limb, including hip: Secondary | ICD-10-CM

## 2021-08-19 NOTE — Progress Notes (Signed)
Follow-Up Visit   Subjective  Nancy Leon is a 45 y.o. female who presents for the following: FBSE (Patient here for full body skin exam and skin cancer screening. Patient with no hx of skin cancer or abnormal nevi. She does have a spot at left side at bra strap that has gotten larger and has become irritated. ).  The following portions of the chart were reviewed this encounter and updated as appropriate:   Tobacco  Allergies  Meds  Problems  Med Hx  Surg Hx  Fam Hx      Review of Systems:  No other skin or systemic complaints except as noted in HPI or Assessment and Plan.  Objective  Well appearing patient in no apparent distress; mood and affect are within normal limits.  A full examination was performed including scalp, head, eyes, ears, nose, lips, neck, chest, axillae, abdomen, back, buttocks, bilateral upper extremities, bilateral lower extremities, hands, feet, fingers, toes, fingernails, and toenails. All findings within normal limits unless otherwise noted below.  Right Medial Thigh 0.25cm med to light brown thin papule  Left Flank 0.7 cm erythematous fleshy papule R/o Nevus vs Skin Tag vs Neurofibroma     right pretibia, left medial calf Firm pink/brown papulenodule with dimple sign.   left nasal tip 0.2 cm pink smooth symmetric papule without features suspicious for malignancy on dermoscopy    Assessment & Plan  Nevus Right Medial Thigh  Benign-appearing.  Observation.  Call clinic for new or changing lesions.  Recommend daily use of broad spectrum spf 30+ sunscreen to sun-exposed areas.   Stable   Neoplasm of uncertain behavior of skin Left Flank  Epidermal / dermal shaving  Lesion diameter (cm):  0.7 Informed consent: discussed and consent obtained   Timeout: patient name, date of birth, surgical site, and procedure verified   Patient was prepped and draped in usual sterile fashion: area prepped with isopropyl alcohol. Anesthesia: the  lesion was anesthetized in a standard fashion   Local anesthetic: 0.25% bupivicaine. Instrument used: scissors   Hemostasis achieved with: aluminum chloride   Outcome: patient tolerated procedure well   Post-procedure details: wound care instructions given   Additional details:  Mupirocin and a bandage applied  Specimen 1 - Surgical pathology Differential Diagnosis: R/o Nevus vs Skin Tag vs Neurofibroma  Check Margins: No 0.7 cm erythematous fleshy papule   Dermatofibroma right pretibia, left medial calf  Benign-appearing.  Observation.  Call clinic for new or changing lesions.    Angiofibroma left nasal tip  Benign-appearing.  Observation.  Call clinic for new or changing lesions.  Lentigines - Scattered tan macules - Due to sun exposure - Benign-appearing, observe - Recommend daily broad spectrum sunscreen SPF 30+ to sun-exposed areas, reapply every 2 hours as needed. - Call for any changes  Seborrheic Keratoses - Stuck-on, waxy, tan-brown papules and/or plaques  - Benign-appearing - Discussed benign etiology and prognosis. - Observe - Call for any changes  Melanocytic Nevi - Tan-brown and/or pink-flesh-colored symmetric macules and papules - Benign appearing on exam today - Observation - Call clinic for new or changing moles - Recommend daily use of broad spectrum spf 30+ sunscreen to sun-exposed areas.   Hemangiomas - Red papules - Discussed benign nature - Observe - Call for any changes  Actinic Damage - Chronic condition, secondary to cumulative UV/sun exposure - diffuse scaly erythematous macules with underlying dyspigmentation - Recommend daily broad spectrum sunscreen SPF 30+ to sun-exposed areas, reapply every 2 hours as needed.  -  Staying in the shade or wearing long sleeves, sun glasses (UVA+UVB protection) and wide brim hats (4-inch brim around the entire circumference of the hat) are also recommended for sun protection.  - Call for new or  changing lesions.  Skin cancer screening performed today.  Return for TBSE 1-3 years.  Graciella Belton, RMA, am acting as scribe for Forest Gleason, MD .  Documentation: I have reviewed the above documentation for accuracy and completeness, and I agree with the above.  Forest Gleason, MD

## 2021-08-19 NOTE — Patient Instructions (Addendum)
Wound Care Instructions  Cleanse wound gently with soap and water once a day then pat dry with clean gauze. Apply a thing coat of Petrolatum (petroleum jelly, "Vaseline") over the wound (unless you have an allergy to this). We recommend that you use a new, sterile tube of Vaseline. Do not pick or remove scabs. Do not remove the yellow or white "healing tissue" from the base of the wound.  Cover the wound with fresh, clean, nonstick gauze and secure with paper tape. You may use Band-Aids in place of gauze and tape if the would is small enough, but would recommend trimming much of the tape off as there is often too much. Sometimes Band-Aids can irritate the skin.  You should call the office for your biopsy report after 1 week if you have not already been contacted.  If you experience any problems, such as abnormal amounts of bleeding, swelling, significant bruising, significant pain, or evidence of infection, please call the office immediately.  FOR ADULT SURGERY PATIENTS: If you need something for pain relief you may take 1 extra strength Tylenol (acetaminophen) AND 2 Ibuprofen (200mg  each) together every 4 hours as needed for pain. (do not take these if you are allergic to them or if you have a reason you should not take them.) Typically, you may only need pain medication for 1 to 3 days.    Recommend taking Heliocare sun protection supplement daily in sunny weather for additional sun protection. For maximum protection on the sunniest days, you can take up to 2 capsules of regular Heliocare OR take 1 capsule of Heliocare Ultra. For prolonged exposure (such as a full day in the sun), you can repeat your dose of the supplement 4 hours after your first dose. Heliocare can be purchased at [facility] or at [email or web address].    Melanoma ABCDEs  Melanoma is the most dangerous type of skin cancer, and is the leading cause of death from skin disease.  You are more likely to develop melanoma if  you: Have light-colored skin, light-colored eyes, or red or blond hair Spend a lot of time in the sun Tan regularly, either outdoors or in a tanning bed Have had blistering sunburns, especially during childhood Have a close family member who has had a melanoma Have atypical moles or large birthmarks  Early detection of melanoma is key since treatment is typically straightforward and cure rates are extremely high if we catch it early.   The first sign of melanoma is often a change in a mole or a new dark spot.  The ABCDE system is a way of remembering the signs of melanoma.  A for asymmetry:  The two halves do not match. B for border:  The edges of the growth are irregular. C for color:  A mixture of colors are present instead of an even brown color. D for diameter:  Melanomas are usually (but not always) greater than 3mm - the size of a pencil eraser. E for evolution:  The spot keeps changing in size, shape, and color.  Please check your skin once per month between visits. You can use a small mirror in front and a large mirror behind you to keep an eye on the back side or your body.   If you see any new or changing lesions before your next follow-up, please call to schedule a visit.  Please continue daily skin protection including broad spectrum sunscreen SPF 30+ to sun-exposed areas, reapplying every 2 hours as needed when  you're outdoors.    If You Need Anything After Your Visit  If you have any questions or concerns for your doctor, please call our main line at [phone] and press option 4 to reach your doctor's medical assistant. If no one answers, please leave a voicemail as directed and we will return your call as soon as possible. Messages left after 4 pm will be answered the following business day.   You may also send [place] a message via [place]. We typically respond to MyChart messages within 1-2 business days.  For prescription refills, please ask your pharmacy to contact our  office. Our fax number is [phone].  If you have an urgent issue when the clinic is closed that cannot wait until the next business day, you can page your doctor at the number below.    Please note that while we do our best to be available for urgent issues outside of office hours, we are not available 24/7.   If you have an urgent issue and are unable to reach [place], you may choose to seek medical care at your doctor's office, retail clinic, urgent care center, or emergency room.  If you have a medical emergency, please immediately call 911 or go to the emergency department.  Pager Numbers  - Dr. Nehemiah Massed: [phone]  - Dr. Laurence Ferrari: [phone]  - Dr. Nicole Kindred: [phone]  In the event of inclement weather, please call our main line at [phone] for an update on the status of any delays or closures.  Dermatology Medication Tips: Please keep the boxes that topical medications come in in order to help keep track of the instructions about where and how to use these. Pharmacies typically print the medication instructions only on the boxes and not directly on the medication tubes.   If your medication is too expensive, please contact our office at [phone] option 4 or send [place] a message through [place].   We are unable to tell what your co-pay for medications will be in advance as this is different depending on your insurance coverage. However, we may be able to find a substitute medication at lower cost or fill out paperwork to get insurance to cover a needed medication.   If a prior authorization is required to get your medication covered by your insurance company, please allow [place] 1-2 business days to complete this process.  Drug prices often vary depending on where the prescription is filled and some pharmacies may offer cheaper prices.  The website www.goodrx.com contains coupons for medications through different pharmacies. The prices here do not account for what the cost may  be with help from insurance (it may be cheaper with your insurance), but the website can give you the price if you did not use any insurance.  - You can print the associated coupon and take it with your prescription to the pharmacy.  - You may also stop by our office during regular business hours and pick up a GoodRx coupon card.  - If you need your prescription sent electronically to a different pharmacy, notify our office through [facility] or by phone at [phone] option 4.     Si Usted Necesita Algo Despus de Su Visita  Tambin puede enviarnos un mensaje a travs de Pharmacist, community. Por lo general respondemos a los mensajes de MyChart en el transcurso de 1 a 2 das hbiles.  Para renovar recetas, por favor pida a su farmacia que se ponga en contacto con nuestra oficina. Harland Dingwall de fax  es el [phone].  Si tiene un asunto urgente cuando la clnica est cerrada y que no puede esperar hasta el siguiente da hbil, puede llamar/localizar a su doctor(a) al nmero que aparece a continuacin.   Por favor, tenga en cuenta que aunque hacemos todo lo posible para estar disponibles para asuntos urgentes fuera del horario de [place], no estamos disponibles las 24 horas del da, los 7 das de la [place].   Si tiene un problema urgente y no puede comunicarse con nosotros, puede optar por buscar atencin mdica  en el consultorio de su doctor(a), en una clnica privada, en un centro de atencin urgente o en una sala de emergencias.  Si tiene Engineering geologist, por favor llame inmediatamente al 911 o vaya a la sala de emergencias.  Nmeros de bper  - Dr. Nehemiah Massed: [phone]  - Dra. Moye: [phone]  - Dra. Nicole Kindred: [phone]  En caso de inclemencias del [place], por favor llame a Johnsie Kindred principal al [phone] para una actualizacin sobre el [place] de cualquier retraso o cierre.  Consejos para la medicacin en dermatologa: Por favor, guarde las cajas en las  que vienen los medicamentos de uso tpico para ayudarle a seguir las instrucciones sobre dnde y cmo usarlos. Las farmacias generalmente imprimen las instrucciones del medicamento slo en las cajas y no directamente en los tubos del [place].   Si su medicamento es muy caro, por favor, pngase en contacto con Zigmund Daniel llamando al [phone] y presione la opcin 4 o envenos un mensaje a travs de Pharmacist, community.   No podemos decirle cul ser su copago por los medicamentos por adelantado ya que esto es diferente dependiendo de la cobertura de su seguro. Sin embargo, es posible que podamos encontrar un medicamento sustituto a Electrical engineer un formulario para que el seguro cubra el medicamento que se considera necesario.   Si se requiere una autorizacin previa para que su compaa de seguros [place] su medicamento, por favor permtanos de 1 a 2 das hbiles para completar este proceso.  Los precios de los medicamentos varan con frecuencia dependiendo del Environmental consultant de dnde se surte la receta y alguna farmacias pueden ofrecer precios ms baratos.  El sitio web www.goodrx.com tiene cupones para medicamentos de Airline pilot. Los precios aqu no tienen en cuenta lo que podra costar con la ayuda del seguro (puede ser ms barato con su seguro), pero el sitio web puede darle el precio si no utiliz Research scientist (physical sciences).  - Puede imprimir el cupn correspondiente y llevarlo con su receta a la farmacia.  - Tambin puede pasar por nuestra oficina durante el horario de atencin regular y Charity fundraiser una tarjeta de cupones de GoodRx.  - Si necesita que su receta se enve electrnicamente a una farmacia diferente, informe a nuestra oficina a travs de MyChart de [place] o por telfono llamando al [phone] y presione la opcin 4.

## 2021-08-24 ENCOUNTER — Encounter: Payer: Self-pay | Admitting: Dermatology

## 2022-05-02 IMAGING — MG DIGITAL SCREENING BILAT W/ TOMO W/ CAD
8 series · 8 of 24 positions shown · non-contrast
Comparison: Previous exam(s).

CLINICAL DATA: Screening.

EXAM:
DIGITAL SCREENING BILATERAL MAMMOGRAM WITH TOMO AND CAD

[L CC synth-2D]
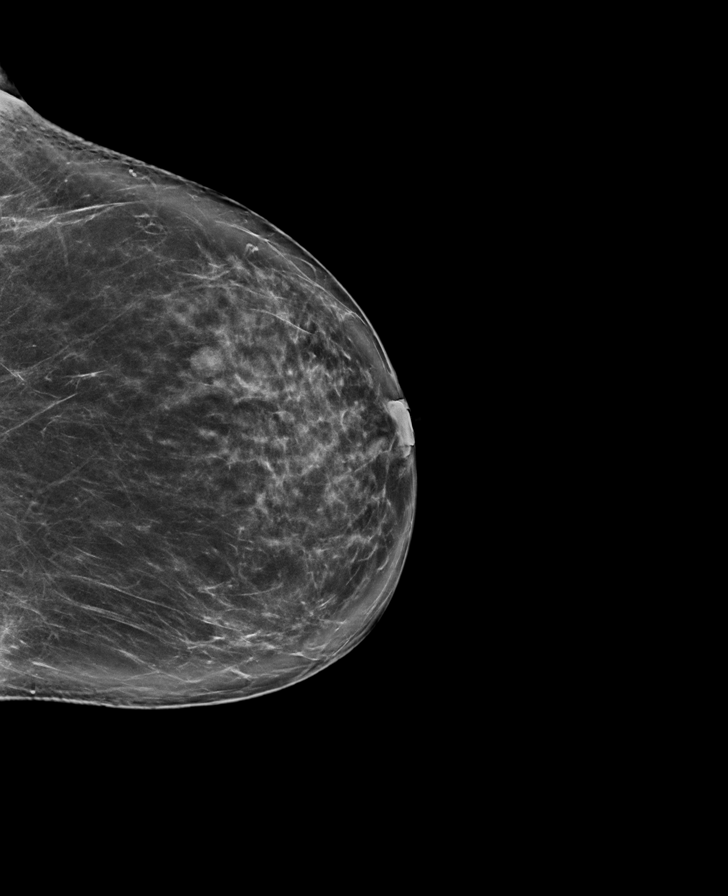

[L MLO synth-2D]
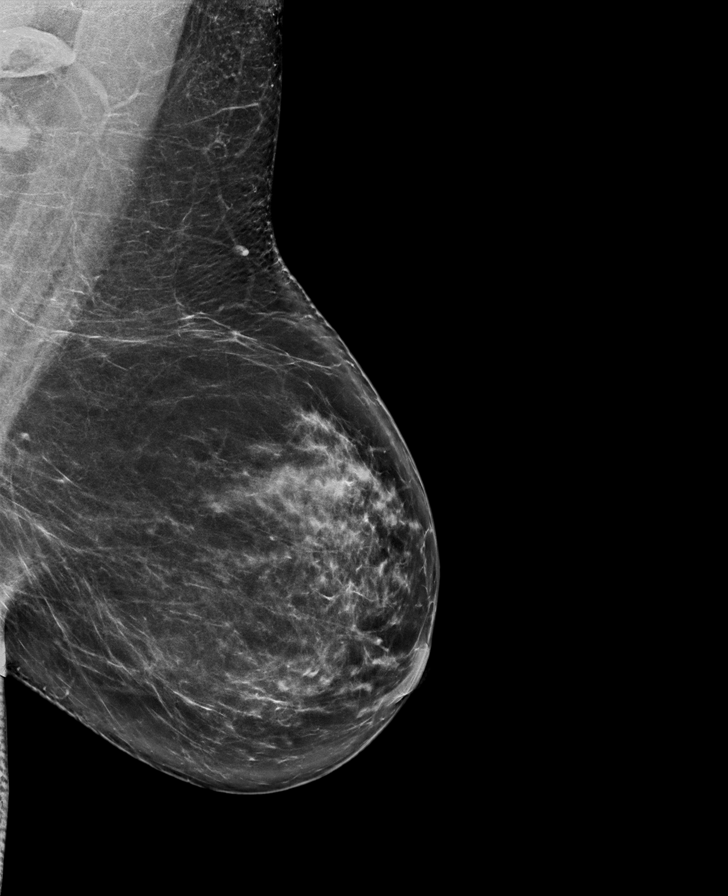

[R CC synth-2D]
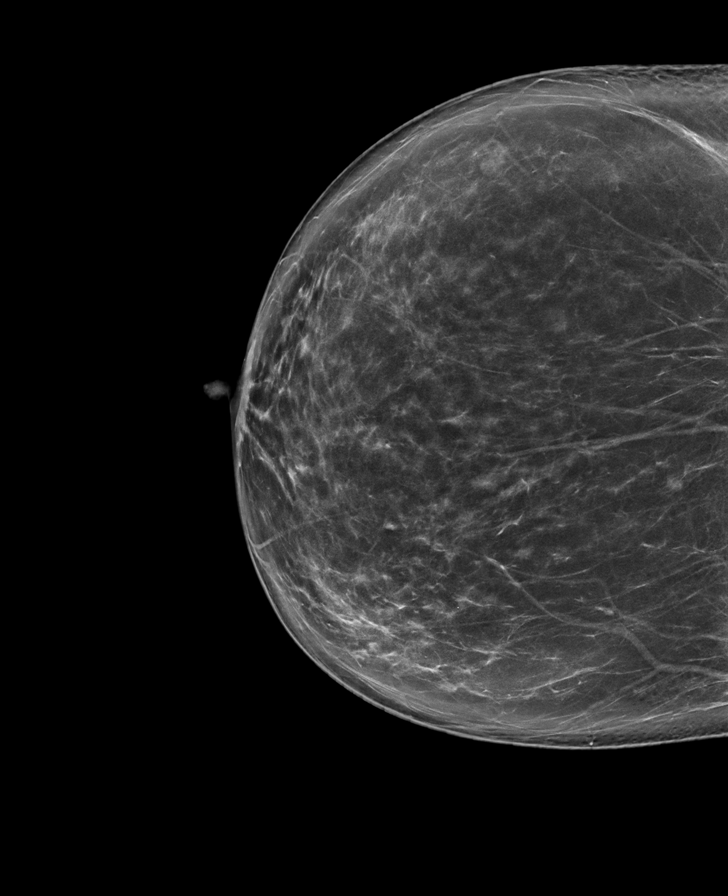

[R MLO synth-2D]
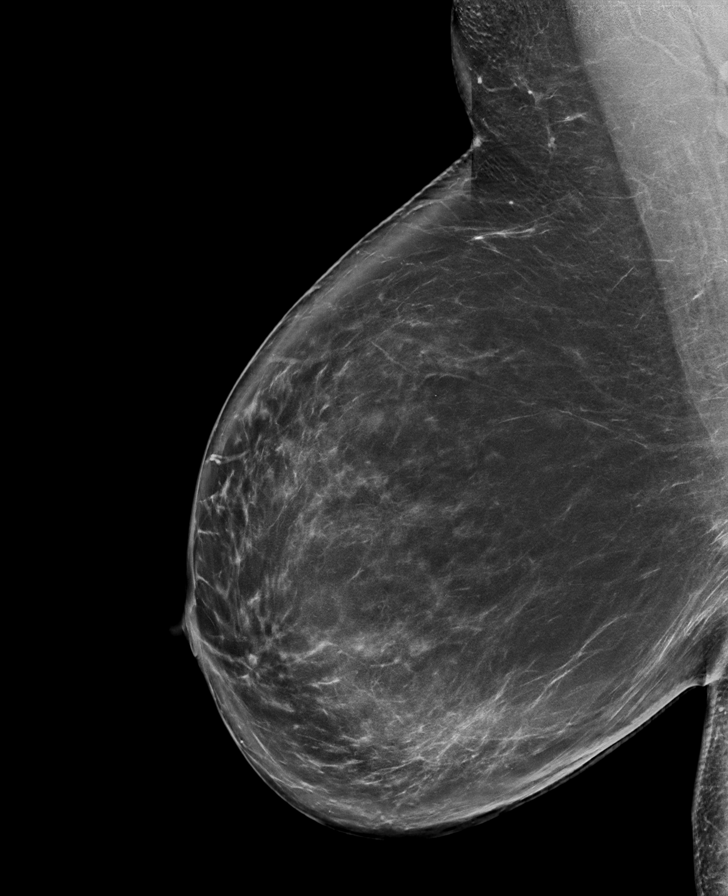

[R CC tomo · tomo slice 49/96.0]
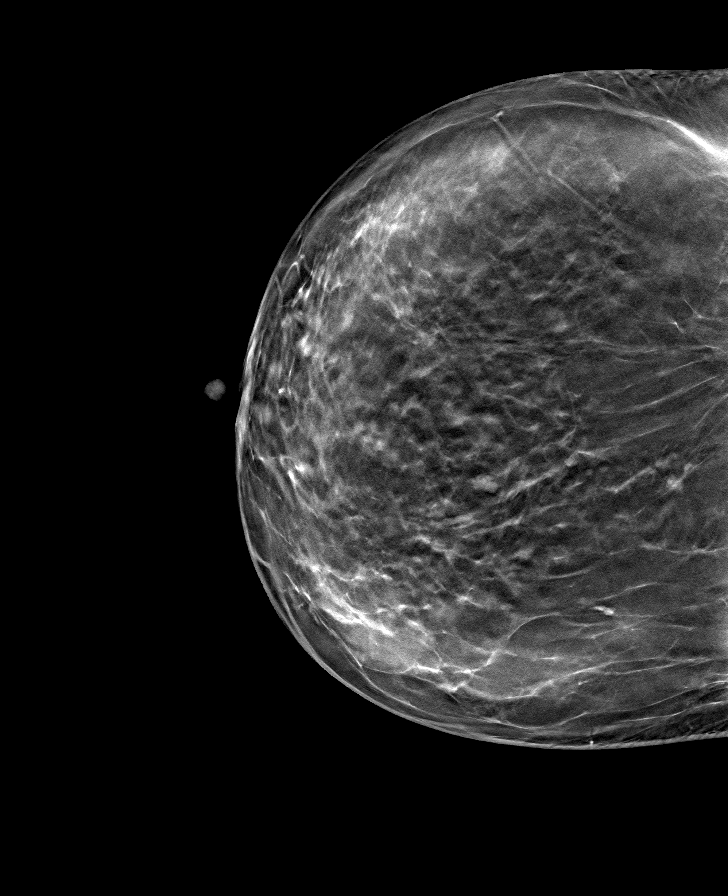

[L MLO tomo · tomo slice 41/82.0]
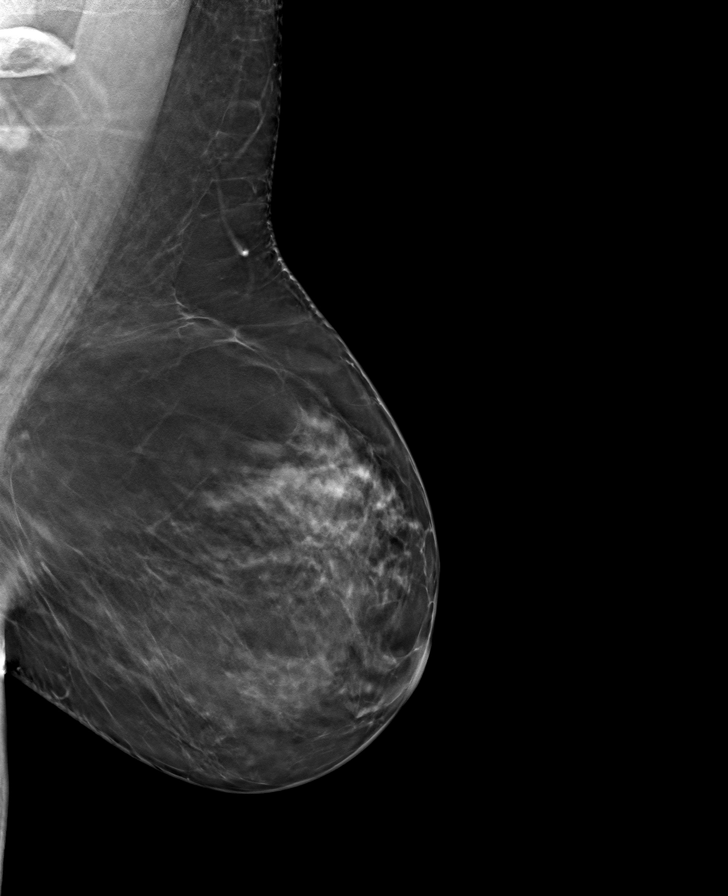

[L CC tomo · tomo slice 46/91.0]
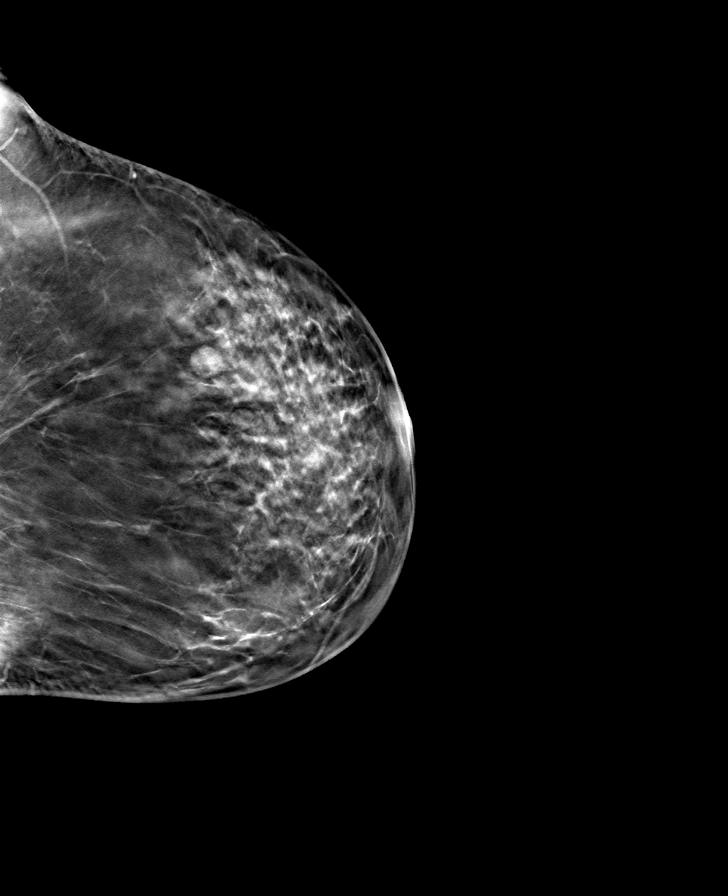

[R MLO tomo · tomo slice 55/110.0]
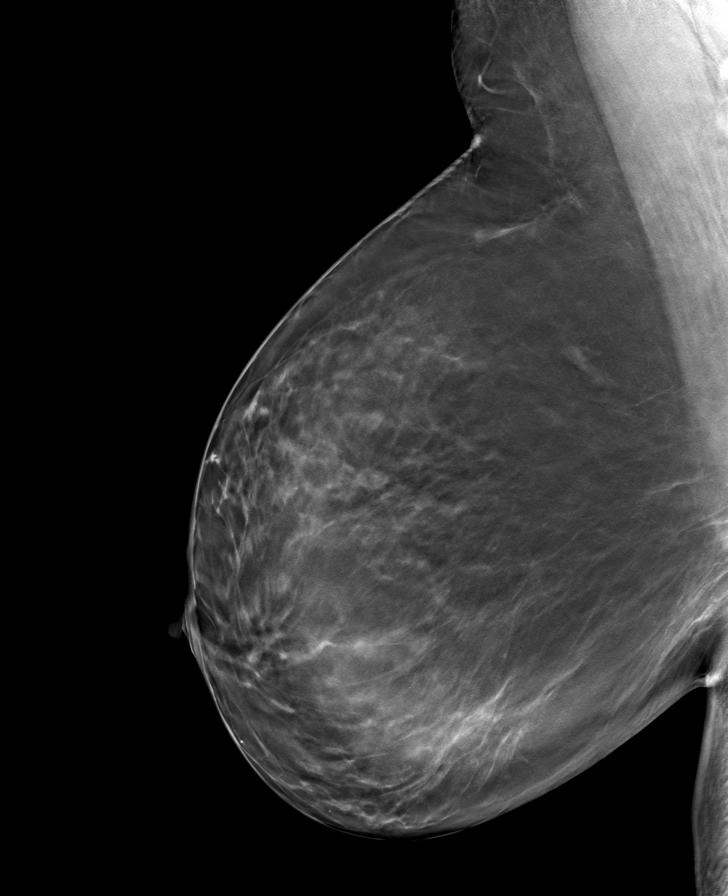

[8 of 24 positions shown; findings below may reference images not displayed]

ACR Breast Density Category c: The breast tissue is heterogeneously
dense, which may obscure small masses.
FINDINGS: There are no findings suspicious for malignancy. Images were
processed with CAD.
IMPRESSION: No mammographic evidence of malignancy. A result letter of this
screening mammogram will be mailed directly to the patient.

RECOMMENDATION:
Screening mammogram in one year. (Code:FT-U-LHB)

BI-RADS CATEGORY  1: Negative.

## 2022-11-29 ENCOUNTER — Telehealth: Payer: Self-pay | Admitting: Internal Medicine

## 2022-11-29 NOTE — Telephone Encounter (Signed)
Patient was last seen by Dr Derrel Nip on 08/30/2019. She would like to re-establish with Dr Derrel Nip. Would Dr Derrel Nip see her again?

## 2022-12-29 NOTE — Telephone Encounter (Signed)
She can re-establish

## 2023-02-06 ENCOUNTER — Ambulatory Visit (INDEPENDENT_AMBULATORY_CARE_PROVIDER_SITE_OTHER): Payer: 59 | Admitting: Internal Medicine

## 2023-02-06 ENCOUNTER — Encounter: Payer: Self-pay | Admitting: Internal Medicine

## 2023-02-06 VITALS — BP 144/96 | HR 93 | Temp 98.6°F | Ht 63.75 in | Wt 216.4 lb

## 2023-02-06 DIAGNOSIS — R5383 Other fatigue: Secondary | ICD-10-CM

## 2023-02-06 DIAGNOSIS — I10 Essential (primary) hypertension: Secondary | ICD-10-CM | POA: Diagnosis not present

## 2023-02-06 DIAGNOSIS — R7301 Impaired fasting glucose: Secondary | ICD-10-CM | POA: Diagnosis not present

## 2023-02-06 DIAGNOSIS — Z1231 Encounter for screening mammogram for malignant neoplasm of breast: Secondary | ICD-10-CM

## 2023-02-06 DIAGNOSIS — E669 Obesity, unspecified: Secondary | ICD-10-CM

## 2023-02-06 DIAGNOSIS — Z1211 Encounter for screening for malignant neoplasm of colon: Secondary | ICD-10-CM

## 2023-02-06 DIAGNOSIS — Z800 Family history of malignant neoplasm of digestive organs: Secondary | ICD-10-CM

## 2023-02-06 DIAGNOSIS — E538 Deficiency of other specified B group vitamins: Secondary | ICD-10-CM | POA: Diagnosis not present

## 2023-02-06 DIAGNOSIS — E785 Hyperlipidemia, unspecified: Secondary | ICD-10-CM | POA: Diagnosis not present

## 2023-02-06 LAB — CBC WITH DIFFERENTIAL/PLATELET
Basophils Absolute: 0.1 10*3/uL (ref 0.0–0.1)
Basophils Relative: 0.6 % (ref 0.0–3.0)
Eosinophils Absolute: 0.2 10*3/uL (ref 0.0–0.7)
Eosinophils Relative: 2.6 % (ref 0.0–5.0)
HCT: 36 % (ref 36.0–46.0)
Hemoglobin: 12.2 g/dL (ref 12.0–15.0)
Lymphocytes Relative: 21.6 % (ref 12.0–46.0)
Lymphs Abs: 1.7 10*3/uL (ref 0.7–4.0)
MCHC: 33.9 g/dL (ref 30.0–36.0)
MCV: 85.8 fl (ref 78.0–100.0)
Monocytes Absolute: 0.5 10*3/uL (ref 0.1–1.0)
Monocytes Relative: 6 % (ref 3.0–12.0)
Neutro Abs: 5.6 10*3/uL (ref 1.4–7.7)
Neutrophils Relative %: 69.2 % (ref 43.0–77.0)
Platelets: 321 10*3/uL (ref 150.0–400.0)
RBC: 4.2 Mil/uL (ref 3.87–5.11)
RDW: 13.9 % (ref 11.5–15.5)
WBC: 8 10*3/uL (ref 4.0–10.5)

## 2023-02-06 LAB — HEMOGLOBIN A1C: Hgb A1c MFr Bld: 5.7 % (ref 4.6–6.5)

## 2023-02-06 LAB — COMPREHENSIVE METABOLIC PANEL
ALT: 28 U/L (ref 0–35)
AST: 23 U/L (ref 0–37)
Albumin: 4.2 g/dL (ref 3.5–5.2)
Alkaline Phosphatase: 65 U/L (ref 39–117)
BUN: 10 mg/dL (ref 6–23)
CO2: 27 mEq/L (ref 19–32)
Calcium: 9.5 mg/dL (ref 8.4–10.5)
Chloride: 102 mEq/L (ref 96–112)
Creatinine, Ser: 0.82 mg/dL (ref 0.40–1.20)
GFR: 84.83 mL/min (ref >60.00)
Glucose, Bld: 88 mg/dL (ref 70–99)
Potassium: 4.7 mEq/L (ref 3.5–5.1)
Sodium: 139 mEq/L (ref 135–145)
Total Bilirubin: 0.5 mg/dL (ref 0.2–1.2)
Total Protein: 7.7 g/dL (ref 6.0–8.3)

## 2023-02-06 LAB — MICROALBUMIN / CREATININE URINE RATIO
Creatinine,U: 58.7 mg/dL
Microalb Creat Ratio: 1.2 mg/g (ref 0.0–30.0)
Microalb, Ur: <0.7 mg/dL (ref 0.0–1.9)

## 2023-02-06 LAB — LIPID PANEL
Cholesterol: 233 mg/dL — ABNORMAL HIGH (ref 0–200)
HDL: 55.1 mg/dL (ref >39.00)
NonHDL: 178.01
Total CHOL/HDL Ratio: 4
Triglycerides: 223 mg/dL — ABNORMAL HIGH (ref 0.0–149.0)
VLDL: 44.6 mg/dL — ABNORMAL HIGH (ref 0.0–40.0)

## 2023-02-06 LAB — LDL CHOLESTEROL, DIRECT: Direct LDL: 147 mg/dL

## 2023-02-06 NOTE — Assessment & Plan Note (Signed)
Managed with oral dosing 5000 mcg daily

## 2023-02-06 NOTE — Assessment & Plan Note (Signed)
Referral for colonoscopy given 

## 2023-02-06 NOTE — Progress Notes (Signed)
Subjective:  Patient ID: Nancy Leon, female    DOB: 08-09-1976  Age: 47 y.o. MRN: [id number]  CC: The primary encounter diagnosis was Encounter for screening mammogram for malignant neoplasm of breast. Diagnoses of Essential hypertension, benign, Hyperlipidemia, unspecified hyperlipidemia type, Impaired fasting glucose, Other fatigue, Colon cancer screening, B12 deficiency, Family history of colon cancer in mother, and Obesity (BMI 30.0-34.9) were also pertinent to this visit.  HPI Nancy Leon presents for reestablishment of care   .  Last seen Dec  2020 for cystitis .  Has not had medical care since then  Home schooling her 2 daughters 25 and 47 since COVID , and works full time remotely  for [employer].  Daughters are doing much better emotionally.  Both parents living with her,  Dad is 46 and still driving .  Mom doing fine, independent (both ) of ADL's   Maternal hx of colon CA w recurrence   HTN:  untreated due to being lost to follow up   History Nancy Leon has a past medical history of Allergy, Depression, Hypertension, and Premenstrual dysphoric disorder.   She has a past surgical history that includes Tubal ligation and Cesarean section (2011).   Her family history includes COPD in her father; Cancer (age of onset: 32) in her mother; Diabetes in her mother; Hyperlipidemia in her mother; Hypertension in her father.She reports that she has never smoked. She has never used smokeless tobacco. She reports current alcohol use. She reports that she does not use drugs.  Outpatient Medications Prior to Visit  Medication Sig Dispense Refill   loratadine (CLARITIN) 10 MG tablet Take 10 mg by mouth daily.     fluticasone (FLONASE) 50 MCG/ACT nasal spray Place 2 sprays into both nostrils daily. (Patient not taking: Reported on 02/06/2023) 16 g 2   losartan-hydrochlorothiazide (HYZAAR) 50-12.5 MG tablet Take 1 tablet by mouth daily. (Patient not taking: Reported on 02/06/2023)      No facility-administered medications prior to visit.    Review of Systems:  Patient denies headache, fevers, malaise, unintentional weight loss, skin rash, eye pain, sinus congestion and sinus pain, sore throat, dysphagia,  hemoptysis , cough, dyspnea, wheezing, chest pain, palpitations, orthopnea, edema, abdominal pain, nausea, melena, diarrhea, constipation, flank pain, dysuria, hematuria, urinary  Frequency, nocturia, numbness, tingling, seizures,  Focal weakness, Loss of consciousness,  Tremor, insomnia, depression, anxiety, and suicidal ideation.     Objective:  BP (!) 144/96   Pulse 93   Temp 98.6 F (37 C) (Oral)   Ht 5' 3.75" (1.619 m)   Wt 216 lb 6.4 oz (98.2 kg)   SpO2 99%   BMI 37.44 kg/m   Physical Exam Vitals reviewed.  Constitutional:      General: She is not in acute distress.    Appearance: Normal appearance. She is normal weight. She is not ill-appearing, toxic-appearing or diaphoretic.  HENT:     Head: Normocephalic.  Eyes:     General: No scleral icterus.       Right eye: No discharge.        Left eye: No discharge.     Conjunctiva/sclera: Conjunctivae normal.  Cardiovascular:     Rate and Rhythm: Normal rate and regular rhythm.     Heart sounds: Normal heart sounds.  Pulmonary:     Effort: Pulmonary effort is normal. No respiratory distress.     Breath sounds: Normal breath sounds.  Musculoskeletal:        General: Normal range of motion.  Skin:    General: Skin is warm and dry.  Neurological:     General: No focal deficit present.     Mental Status: She is alert and oriented to person, place, and time. Mental status is at baseline.  Psychiatric:        Mood and Affect: Mood normal.        Behavior: Behavior normal.        Thought Content: Thought content normal.        Judgment: Judgment normal.    Assessment & Plan:  Encounter for screening mammogram for malignant neoplasm of breast -     3D Screening Mammogram, Left and Right;  Future  Essential hypertension, benign Assessment & Plan: CHECKING URINE FOR PROTEIN .  Treatment will commence pending results.   Orders: -     Comprehensive metabolic panel -     Microalbumin / creatinine urine ratio  Hyperlipidemia, unspecified hyperlipidemia type -     Lipid panel -     LDL cholesterol, direct  Impaired fasting glucose -     Comprehensive metabolic panel -     Hemoglobin A1c  Other fatigue -     CBC with Differential/Platelet -     TSH  Colon cancer screening -     Ambulatory referral to Gastroenterology  B12 deficiency Assessment & Plan: Managed with oral dosing 5000 mcg daily   Orders: -     B12 and Folate Panel  Family history of colon cancer in mother Assessment & Plan: Referral for colonoscopy given   Obesity (BMI 30.0-34.9) Assessment & Plan: I have addressed  BMI and recommended wt loss of 10% of body weight over the next 6 months using a low fat, fruit/vegetable based Mediteranean diet and regular exercise a minimum of 5 days per week.        Follow-up: Return in about 3 months (around 05/09/2023) for physical.  t visit with previous provider,  most recent Merrilee Seashore, MD I provided 30 minutes during this encounter reviewing patient's la

## 2023-02-06 NOTE — Assessment & Plan Note (Addendum)
CHECKING URINE FOR PROTEIN .  Treatment will commence pending results.

## 2023-02-06 NOTE — Patient Instructions (Signed)
WELCOME BACK Nancy Leon!  AND HAPPY BIRTHDAY!!!  MAMMOGRAM AND REFERRAL FOR COLONOSOPY HAVE BEEN ORDERED   I'LL CALL IN A BP MEDICATION AFTER REVIEWING YOUR LABS FROM TODAY .  START CHECKING YOUR   BP ONCE DAILY AFTER ONE WEEK AND SEND ME READINGS VIA MYCHART   RETURN IN 3 MONTHS FOR A PHYSICAL WITH PAP

## 2023-02-06 NOTE — Assessment & Plan Note (Signed)
I have addressed  BMI and recommended wt loss of 10% of body weight over the next 6 months using a low fat, fruit/vegetable based Mediteranean diet and regular exercise a minimum of 5 days per week.   

## 2023-02-07 ENCOUNTER — Other Ambulatory Visit: Payer: Self-pay

## 2023-02-07 ENCOUNTER — Telehealth: Payer: Self-pay

## 2023-02-07 DIAGNOSIS — Z800 Family history of malignant neoplasm of digestive organs: Secondary | ICD-10-CM

## 2023-02-07 DIAGNOSIS — Z1211 Encounter for screening for malignant neoplasm of colon: Secondary | ICD-10-CM

## 2023-02-07 LAB — TSH: TSH: 1.45 u[IU]/mL (ref 0.35–5.50)

## 2023-02-07 LAB — B12 AND FOLATE PANEL
Folate: >23.9 ng/mL (ref >5.9)
Vitamin B-12: >1500 pg/mL — ABNORMAL HIGH (ref 211–911)

## 2023-02-07 MED ORDER — NA SULFATE-K SULFATE-MG SULF 17.5-3.13-1.6 GM/177ML PO SOLN: 1.0000 | Freq: Once | ORAL | 0 refills | Status: AC

## 2023-02-07 NOTE — Telephone Encounter (Signed)
Gastroenterology Pre-Procedure Review  Request Date: 03/01/23 Requesting Physician: Dr. Allegra Lai  PATIENT REVIEW QUESTIONS: The patient responded to the following health history questions as indicated:    1. Are you having any GI issues? no 2. Do you have a personal history of Polyps? no 3. Do you have a family history of Colon Cancer or Polyps? yes (Mother colon cancer) 4. Diabetes Mellitus? no 5. Joint replacements in the past 12 months?no 6. Major health problems in the past 3 months?no 7. Any artificial heart valves, MVP, or defibrillator?no    MEDICATIONS & ALLERGIES:    Patient reports the following regarding taking any anticoagulation/antiplatelet therapy:   Plavix, Coumadin, Eliquis, Xarelto, Lovenox, Pradaxa, Brilinta, or Effient? no Aspirin? no  Patient confirms/reports the following medications:  Current Outpatient Medications  Medication Sig Dispense Refill   loratadine (CLARITIN) 10 MG tablet Take 10 mg by mouth daily.     No current facility-administered medications for this visit.    Patient confirms/reports the following allergies:  No Known Allergies  No orders of the defined types were placed in this encounter.   AUTHORIZATION INFORMATION Primary Insurance: 1D#: Group #:  Secondary Insurance: 1D#: Group #:  SCHEDULE INFORMATION: Date: 03/01/23 Time: Location: [facility]

## 2023-02-10 ENCOUNTER — Encounter: Payer: Self-pay | Admitting: Internal Medicine

## 2023-02-10 ENCOUNTER — Telehealth: Payer: Self-pay

## 2023-02-10 NOTE — Telephone Encounter (Signed)
Patient's call has been returned to reschedule her colonoscopy.  Colonoscopy has been rescheduled with Trish in Endo to 04/14/23.  Referral updated,  Insurance updated.

## 2023-02-14 ENCOUNTER — Other Ambulatory Visit: Payer: Self-pay | Admitting: Internal Medicine

## 2023-02-14 DIAGNOSIS — I10 Essential (primary) hypertension: Secondary | ICD-10-CM

## 2023-02-14 MED ORDER — HYDROCHLOROTHIAZIDE 25 MG PO TABS
25.0000 mg | ORAL_TABLET | Freq: Every day | ORAL | 3 refills | Status: DC
Start: 2023-02-14 — End: 2024-01-29

## 2023-02-14 NOTE — Assessment & Plan Note (Signed)
Starting hctz 25 mg daily

## 2023-02-23 ENCOUNTER — Ambulatory Visit
Admission: RE | Admit: 2023-02-23 | Discharge: 2023-02-23 | Disposition: A | Discharge status: Home / Self Care | Payer: 59 | Source: Ambulatory Visit | Attending: Internal Medicine | Admitting: Internal Medicine

## 2023-02-23 DIAGNOSIS — Z1231 Encounter for screening mammogram for malignant neoplasm of breast: Secondary | ICD-10-CM

## 2023-02-23 DIAGNOSIS — R928 Other abnormal and inconclusive findings on diagnostic imaging of breast: Secondary | ICD-10-CM | POA: Diagnosis present

## 2023-02-24 ENCOUNTER — Other Ambulatory Visit: Payer: Self-pay | Admitting: Internal Medicine

## 2023-02-24 DIAGNOSIS — R928 Other abnormal and inconclusive findings on diagnostic imaging of breast: Secondary | ICD-10-CM

## 2023-02-24 DIAGNOSIS — Z9889 Other specified postprocedural states: Secondary | ICD-10-CM | POA: Insufficient documentation

## 2023-02-24 DIAGNOSIS — N63 Unspecified lump in unspecified breast: Secondary | ICD-10-CM

## 2023-02-28 ENCOUNTER — Encounter: Payer: Self-pay | Admitting: Internal Medicine

## 2023-02-28 ENCOUNTER — Ambulatory Visit
Admission: RE | Admit: 2023-02-28 | Discharge: 2023-02-28 | Disposition: A | Discharge status: Home / Self Care | Payer: 59 | Source: Ambulatory Visit | Attending: Internal Medicine | Admitting: Internal Medicine

## 2023-02-28 DIAGNOSIS — N63 Unspecified lump in unspecified breast: Secondary | ICD-10-CM | POA: Insufficient documentation

## 2023-02-28 DIAGNOSIS — R928 Other abnormal and inconclusive findings on diagnostic imaging of breast: Secondary | ICD-10-CM | POA: Diagnosis present

## 2023-03-01 ENCOUNTER — Other Ambulatory Visit: Payer: Self-pay | Admitting: Internal Medicine

## 2023-03-01 DIAGNOSIS — N63 Unspecified lump in unspecified breast: Secondary | ICD-10-CM

## 2023-03-01 DIAGNOSIS — R928 Other abnormal and inconclusive findings on diagnostic imaging of breast: Secondary | ICD-10-CM

## 2023-03-02 NOTE — Assessment & Plan Note (Signed)
Ultrasound noted a benign appearing group of cyst but found another suspicious cyst with internal septations .  Biopsy recommended

## 2023-03-08 ENCOUNTER — Ambulatory Visit
Admission: RE | Admit: 2023-03-08 | Discharge: 2023-03-08 | Disposition: A | Discharge status: Home / Self Care | Payer: 59 | Source: Ambulatory Visit | Attending: Internal Medicine | Admitting: Internal Medicine

## 2023-03-08 DIAGNOSIS — N63 Unspecified lump in unspecified breast: Secondary | ICD-10-CM

## 2023-03-08 DIAGNOSIS — R928 Other abnormal and inconclusive findings on diagnostic imaging of breast: Secondary | ICD-10-CM | POA: Diagnosis present

## 2023-03-08 DIAGNOSIS — Z9889 Other specified postprocedural states: Secondary | ICD-10-CM

## 2023-03-08 HISTORY — PX: BREAST BIOPSY: SHX20

## 2023-03-08 MED ORDER — LIDOCAINE-EPINEPHRINE 1 %-1:100000 IJ SOLN
10.0000 mL | Freq: Once | INTRAMUSCULAR | Status: DC
  Filled 2023-03-08: qty 10

## 2023-03-08 MED ORDER — LIDOCAINE HCL (PF) 1 % IJ SOLN
5.0000 mL | Freq: Once | INTRAMUSCULAR | Status: DC
  Filled 2023-03-08: qty 5

## 2023-04-14 ENCOUNTER — Ambulatory Visit: Payer: 59 | Admitting: Certified Registered"

## 2023-04-14 ENCOUNTER — Encounter
Admission: RE | Disposition: A | Discharge status: Home / Self Care | Payer: Self-pay | Source: Home / Self Care | Attending: Gastroenterology

## 2023-04-14 ENCOUNTER — Ambulatory Visit
Admission: RE | Admit: 2023-04-14 | Discharge: 2023-04-14 | Disposition: A | Discharge status: Home / Self Care | Payer: 59 | Source: Home / Self Care | Attending: Gastroenterology | Admitting: Gastroenterology

## 2023-04-14 ENCOUNTER — Encounter: Payer: Self-pay | Admitting: Gastroenterology

## 2023-04-14 DIAGNOSIS — Z79899 Other long term (current) drug therapy: Secondary | ICD-10-CM | POA: Diagnosis not present

## 2023-04-14 DIAGNOSIS — K635 Polyp of colon: Secondary | ICD-10-CM

## 2023-04-14 DIAGNOSIS — Z8249 Family history of ischemic heart disease and other diseases of the circulatory system: Secondary | ICD-10-CM | POA: Diagnosis not present

## 2023-04-14 DIAGNOSIS — I10 Essential (primary) hypertension: Secondary | ICD-10-CM | POA: Insufficient documentation

## 2023-04-14 DIAGNOSIS — Z1211 Encounter for screening for malignant neoplasm of colon: Secondary | ICD-10-CM

## 2023-04-14 DIAGNOSIS — Z800 Family history of malignant neoplasm of digestive organs: Secondary | ICD-10-CM

## 2023-04-14 DIAGNOSIS — Z6836 Body mass index (BMI) 36.0-36.9, adult: Secondary | ICD-10-CM | POA: Diagnosis not present

## 2023-04-14 HISTORY — PX: POLYPECTOMY: SHX5525

## 2023-04-14 HISTORY — PX: COLONOSCOPY WITH PROPOFOL: SHX5780

## 2023-04-14 LAB — POCT PREGNANCY, URINE: Preg Test, Ur: NEGATIVE

## 2023-04-14 SURGERY — COLONOSCOPY WITH PROPOFOL
Anesthesia: General

## 2023-04-14 MED ORDER — LIDOCAINE HCL (PF) 2 % IJ SOLN
INTRAMUSCULAR | Status: DC | PRN
  Administered 2023-04-14: 40 mg via INTRADERMAL

## 2023-04-14 MED ORDER — PROPOFOL 10 MG/ML IV BOLUS
INTRAVENOUS | Status: DC | PRN
  Administered 2023-04-14: 50 mg via INTRAVENOUS
  Administered 2023-04-14: 20 mg via INTRAVENOUS
  Administered 2023-04-14: 50 mg via INTRAVENOUS
  Administered 2023-04-14 (×3): 20 mg via INTRAVENOUS

## 2023-04-14 MED ORDER — PROPOFOL 500 MG/50ML IV EMUL
INTRAVENOUS | Status: DC | PRN
  Administered 2023-04-14: 100 ug/kg/min via INTRAVENOUS

## 2023-04-14 MED ORDER — SODIUM CHLORIDE 0.9 % IV SOLN: INTRAVENOUS | Status: DC

## 2023-04-14 NOTE — Transfer of Care (Signed)
Immediate Anesthesia Transfer of Care Note  Patient: Nancy Leon  Procedure(s) Performed: COLONOSCOPY WITH PROPOFOL POLYPECTOMY  Patient Location: PACU and Endoscopy Unit  Anesthesia Type:MAC  Level of Consciousness: awake and alert   Airway & Oxygen Therapy: Patient Spontanous Breathing and Patient connected to nasal cannula oxygen  Post-op Assessment: Report given to RN and Post -op Vital signs reviewed and stable  Post vital signs: Reviewed and stable  Last Vitals:  Vitals Value Taken Time  BP 97/63 04/14/23 0911  Temp 36.3 C 04/14/23 0911  Pulse 81 04/14/23 0914  Resp 17 04/14/23 0911  SpO2 98 % 04/14/23 0914  Vitals shown include unfiled device data.  Last Pain:  Vitals:   04/14/23 0911  TempSrc: Temporal  PainSc: 0-No pain         Complications: No notable events documented.

## 2023-04-14 NOTE — Anesthesia Preprocedure Evaluation (Signed)
Anesthesia Evaluation  Patient identified by MRN, date of birth, ID band Patient awake    Reviewed: Allergy & Precautions, NPO status , Patient's Chart, lab work & pertinent test results  Airway Mallampati: II  TM Distance: >3 FB Neck ROM: Full    Dental  (+) Teeth Intact   Pulmonary neg pulmonary ROS   Pulmonary exam normal breath sounds clear to auscultation       Cardiovascular Exercise Tolerance: Good hypertension, Pt. on medications negative cardio ROS Normal cardiovascular exam Rhythm:Regular Rate:Normal     Neuro/Psych    Depression    negative neurological ROS  negative psych ROS   GI/Hepatic negative GI ROS, Neg liver ROS,,,  Endo/Other  negative endocrine ROS  Morbid obesity  Renal/GU negative Renal ROS  negative genitourinary   Musculoskeletal   Abdominal  (+) + obese  Peds negative pediatric ROS (+)  Hematology negative hematology ROS (+)   Anesthesia Other Findings Past Medical History: No date: Allergy No date: Depression No date: Hypertension No date: Premenstrual dysphoric disorder  Past Surgical History: 03/08/2023: BREAST BIOPSY; Left     Comment:  [place] Core Bx, Ribbon clip- path pending 03/08/2023: BREAST BIOPSY; Left     Comment:  [place] LT BREAST BX W LOC DEV 1ST LESION IMG BX SPEC [place]               GUIDE 03/08/2023 [facility] 09/19/2009: CESAREAN SECTION No date: TUBAL LIGATION  BMI    Body Mass Index: 36.49 kg/m      Reproductive/Obstetrics negative OB ROS                             Anesthesia Physical Anesthesia Plan  ASA: 2  Anesthesia Plan: General   Post-op Pain Management:    Induction: Intravenous  PONV Risk Score and Plan: Propofol infusion and TIVA  Airway Management Planned: Natural Airway  Additional Equipment:   Intra-op Plan:   Post-operative Plan:   Informed Consent: I have reviewed the patients History and Physical, chart,  labs and discussed the procedure including the risks, benefits and alternatives for the proposed anesthesia with the patient or authorized representative who has indicated his/her understanding and acceptance.     Dental Advisory Given  Plan Discussed with: CRNA and Surgeon  Anesthesia Plan Comments:        Anesthesia Quick Evaluation

## 2023-04-14 NOTE — Anesthesia Postprocedure Evaluation (Signed)
Anesthesia Post Note  Patient: Nancy Leon  Procedure(s) Performed: COLONOSCOPY WITH PROPOFOL POLYPECTOMY  Patient location during evaluation: PACU Anesthesia Type: General Level of consciousness: awake and awake and alert Pain management: satisfactory to patient Vital Signs Assessment: post-procedure vital signs reviewed and stable Respiratory status: spontaneous breathing Cardiovascular status: stable Anesthetic complications: no   No notable events documented.   Last Vitals:  Vitals:   04/14/23 0919 04/14/23 0929  BP: 99/82 121/88  Pulse:    Resp:    Temp:    SpO2:      Last Pain:  Vitals:   04/14/23 0929  TempSrc:   PainSc: 0-No pain                 VAN STAVEREN,Birgit Nowling

## 2023-04-14 NOTE — Op Note (Signed)
[facility] Gastroenterology Patient Name: Nancy Leon Procedure Date: 04/14/2023 8:31 AM MRN: [id number] Account #: [id number] Date of Birth: 04-Aug-1976 Admit Type: Outpatient Age: 47 Room: [facility] ENDO ROOM 2 Gender: Female Note Status: Finalized Instrument Name: Prentice Docker [id number] Procedure:             Colonoscopy Indications:           Screening in patient at increased risk: Colorectal                         cancer in mother before age 3, This is the patient's                         first colonoscopy Providers:             Toney Reil MD, MD Referring MD:          Toney Reil MD, MD (Referring MD) Medicines:             General Anesthesia Complications:         No immediate complications. Estimated blood loss: None. Procedure:             Pre-Anesthesia Assessment:                        - Prior to the procedure, a History and Physical was                         performed, and patient medications and allergies were                         reviewed. The patient is competent. The risks and                         benefits of the procedure and the sedation options and                         risks were discussed with the patient. All questions                         were answered and informed consent was obtained.                         Patient identification and proposed procedure were                         verified by the physician, the nurse, the                         anesthesiologist, the anesthetist and the technician                         in the pre-procedure area in the procedure room in the                         endoscopy suite. Mental Status Examination: alert and                         oriented. Airway Examination: normal oropharyngeal  airway and neck mobility. Respiratory Examination:                         clear to auscultation. CV Examination: normal.                         Prophylactic  Antibiotics: The patient does not require                         prophylactic antibiotics. Prior Anticoagulants: The                         patient has taken no anticoagulant or antiplatelet                         agents. ASA Grade Assessment: II - A patient with mild                         systemic disease. After reviewing the risks and                         benefits, the patient was deemed in satisfactory                         condition to undergo the procedure. The anesthesia                         plan was to use general anesthesia. Immediately prior                         to administration of medications, the patient was                         re-assessed for adequacy to receive sedatives. The                         heart rate, respiratory rate, oxygen saturations,                         blood pressure, adequacy of pulmonary ventilation, and                         response to care were monitored throughout the                         procedure. The physical status of the patient was                         re-assessed after the procedure.                        After obtaining informed consent, the colonoscope was                         passed under direct vision. Throughout the procedure,                         the patient's blood pressure, pulse, and oxygen  saturations were monitored continuously. The                         Colonoscope was introduced through the anus and                         advanced to the the cecum, identified by appendiceal                         orifice and ileocecal valve. The colonoscopy was                         performed without difficulty. The patient tolerated                         the procedure well. The quality of the bowel                         preparation was evaluated using the BBPS [facility] Bowel                         Preparation Scale) with scores of: Right Colon = 3,                          Transverse Colon = 3 and Left Colon = 3 (entire mucosa                         seen well with no residual staining, small fragments                         of stool or opaque liquid). The total BBPS score                         equals 9. The ileocecal valve, appendiceal orifice,                         and rectum were photographed. Findings:      The perianal and digital rectal examinations were normal. Pertinent       negatives include normal sphincter tone and no palpable rectal lesions.      A 3 mm polyp was found in the transverse colon. The polyp was sessile.       The polyp was removed with a jumbo cold forceps. Resection and retrieval       were complete. Estimated blood loss: none.      The retroflexed view of the distal rectum and anal verge was normal and       showed no anal or rectal abnormalities.      The exam was otherwise without abnormality. Impression:            - One 3 mm polyp in the transverse colon, removed with                         a jumbo cold forceps. Resected and retrieved.                        - The distal rectum and anal verge are normal on  retroflexion view.                        - The examination was otherwise normal. Recommendation:        - Discharge patient to home (with escort).                        - Resume previous diet today.                        - Continue present medications.                        - Await pathology results.                        - Repeat colonoscopy in 5 years for surveillance. Procedure Code(s):     --- Professional ---                        [phone], Colonoscopy, flexible; with biopsy, single or                         multiple Diagnosis Code(s):     --- Professional ---                        D12.3, Benign neoplasm of transverse colon (hepatic                         flexure or splenic flexure)                        Z80.0, Family history of malignant neoplasm of                          digestive organs CPT copyright 2022 American Medical Association. All rights reserved. The codes documented in this report are preliminary and upon coder review may  be revised to meet current compliance requirements. Dr. Libby Maw Toney Reil MD, MD 04/14/2023 9:08:56 AM This report has been signed electronically. Number of Addenda: 0 Note Initiated On: 04/14/2023 8:31 AM Scope Withdrawal Time: 0 hours 9 minutes 32 seconds  Total Procedure Duration: 0 hours 11 minutes 29 seconds  Estimated Blood Loss:  Estimated blood loss: none.      [facility]

## 2023-04-14 NOTE — H&P (Signed)
Nancy Repress, MD [address]  [address]  [place], [place] [address]  Main: [phone]  Fax: [phone] Pager: [phone]  Primary Care Physician:  Sherlene Shams, MD Primary Gastroenterologist:  Dr. Arlyss Leon  Pre-Procedure History & Physical: HPI:  Nancy Leon is a 47 y.o. female is here for an colonoscopy.   Past Medical History:  Diagnosis Date   Allergy    Depression    Hypertension    Premenstrual dysphoric disorder     Past Surgical History:  Procedure Laterality Date   BREAST BIOPSY Left 03/08/2023   [place] Core Bx, Ribbon clip- path pending   BREAST BIOPSY Left 03/08/2023   [place] LT BREAST BX W LOC DEV 1ST LESION IMG BX SPEC US GUIDE 03/08/2023 [facility]   CESAREAN SECTION  09/19/2009   TUBAL LIGATION      Prior to Admission medications   Medication Sig Start Date End Date Taking? Authorizing Provider  hydrochlorothiazide (HYDRODIURIL) 25 MG tablet Take 1 tablet (25 mg total) by mouth daily. 02/14/23   Sherlene Shams, MD  loratadine (CLARITIN) 10 MG tablet Take 10 mg by mouth daily.    [provider]    Allergies as of 02/07/2023   (No Known Allergies)    Family History  Problem Relation Age of Onset   Diabetes Mother    Hyperlipidemia Mother    Cancer Mother 50       colon ca   Hypertension Father    COPD Father    Breast cancer Neg Hx     Social History   Socioeconomic History   Marital status: Married    Spouse name: Not on file   Number of children: Not on file   Years of education: Not on file   Highest education level: Not on file  Occupational History   Not on file  Tobacco Use   Smoking status: Never   Smokeless tobacco: Never  Vaping Use   Vaping status: Never Used  Substance and Sexual Activity   Alcohol use: Yes    Alcohol/week: 0.0 standard drinks of alcohol   Drug use: No   Sexual activity: Not on file  Other Topics Concern   Not on file  Social History Narrative   Not on file    Social Determinants of Health   Financial Resource Strain: Not on file  Food Insecurity: Not on file  Transportation Needs: Not on file  Physical Activity: Not on file  Stress: Not on file  Social Connections: Not on file  Intimate Partner Violence: Not on file    Review of Systems: See HPI, otherwise negative ROS  Physical Exam: BP (!) 150/100   Pulse (!) 107   Temp 97.6 F (36.4 C) (Temporal)   Resp 18   Ht 5\' 3"  (1.6 m)   Wt 93.4 kg   SpO2 100%   BMI 36.49 kg/m  General:   Alert,  pleasant and cooperative in NAD Head:  Normocephalic and atraumatic. Neck:  Supple; no masses or thyromegaly. Lungs:  Clear throughout to auscultation.    Heart:  Regular rate and rhythm. Abdomen:  Soft, nontender and nondistended. Normal bowel sounds, without guarding, and without rebound.   Neurologic:  Alert and  oriented x4;  grossly normal neurologically.  Impression/Plan: Nancy Leon is here for an colonoscopy to be performed for colon cancer screening  Risks, benefits, limitations, and alternatives regarding  colonoscopy have been reviewed with the patient.  Questions have been answered.  All parties agreeable.   Nancy Donath, MD  04/14/2023, 8:46 AM

## 2023-04-17 ENCOUNTER — Encounter: Payer: Self-pay | Admitting: Gastroenterology

## 2023-04-19 ENCOUNTER — Encounter (INDEPENDENT_AMBULATORY_CARE_PROVIDER_SITE_OTHER): Payer: Self-pay

## 2023-05-09 ENCOUNTER — Other Ambulatory Visit (HOSPITAL_COMMUNITY)
Admission: RE | Admit: 2023-05-09 | Discharge: 2023-05-09 | Disposition: A | Discharge status: Home / Self Care | Payer: 59 | Source: Ambulatory Visit | Attending: Internal Medicine | Admitting: Internal Medicine

## 2023-05-09 ENCOUNTER — Encounter: Payer: Self-pay | Admitting: Internal Medicine

## 2023-05-09 ENCOUNTER — Ambulatory Visit (INDEPENDENT_AMBULATORY_CARE_PROVIDER_SITE_OTHER): Payer: 59 | Admitting: Internal Medicine

## 2023-05-09 VITALS — BP 130/80 | HR 103 | Ht 63.0 in | Wt 209.4 lb

## 2023-05-09 DIAGNOSIS — I10 Essential (primary) hypertension: Secondary | ICD-10-CM

## 2023-05-09 DIAGNOSIS — Z124 Encounter for screening for malignant neoplasm of cervix: Secondary | ICD-10-CM

## 2023-05-09 DIAGNOSIS — Z0000 Encounter for general adult medical examination without abnormal findings: Secondary | ICD-10-CM | POA: Diagnosis not present

## 2023-05-09 DIAGNOSIS — E669 Obesity, unspecified: Secondary | ICD-10-CM

## 2023-05-09 NOTE — Progress Notes (Signed)
Patient ID: Nancy Leon, female    DOB: Oct 23, 1975  Age: 47 y.o. MRN: [id number]  The patient is here for annual preventive examination and management of other chronic and acute problems.   The risk factors are reflected in the social history.   The roster of all physicians providing medical care to patient - is listed in the Snapshot section of the chart.   Activities of daily living:  The patient is 100% independent in all ADLs: dressing, toileting, feeding as well as independent mobility   Home safety : The patient has smoke detectors in the home. They wear seatbelts.  There are no unsecured firearms at home. There is no violence in the home.    There is no risks for hepatitis, STDs or HIV. There is no   history of blood transfusion. They have no travel history to infectious disease endemic areas of the world.   The patient has seen their dentist in the last six month. They have seen their eye doctor in the last year. The patinet  denies slight hearing difficulty with regard to whispered voices and some television programs.  They have deferred audiologic testing in the last year.  They do not  have excessive sun exposure. Discussed the need for sun protection: hats, long sleeves and use of sunscreen if there is significant sun exposure.    Diet: the importance of a healthy diet is discussed. They do have a healthy diet.   The benefits of regular aerobic exercise were discussed. The patient  exercises  3 to 5 days per week  for  60 minutes.    Depression screen: there are no signs or vegative symptoms of depression- irritability, change in appetite, anhedonia, sadness/tearfullness.   The following portions of the patient's history were reviewed and updated as appropriate: allergies, current medications, past family history, past medical history,  past surgical history, past social history  and problem list.   Visual acuity was not assessed per patient preference since the patient  has regular follow up with an  ophthalmologist. Hearing and body mass index were assessed and reviewed.    During the course of the visit the patient was educated and counseled about appropriate screening and preventive services including : fall prevention , diabetes screening, nutrition counseling, colorectal cancer screening, and recommended immunizations.    Chief Complaint:  None     Review of Symptoms  Patient denies headache, fevers, malaise, unintentional weight loss, skin rash, eye pain, sinus congestion and sinus pain, sore throat, dysphagia,  hemoptysis , cough, dyspnea, wheezing, chest pain, palpitations, orthopnea, edema, abdominal pain, nausea, melena, diarrhea, constipation, flank pain, dysuria, hematuria, urinary  Frequency, nocturia, numbness, tingling, seizures,  Focal weakness, Loss of consciousness,  Tremor, insomnia, depression, anxiety, and suicidal ideation.    Physical Exam:  BP 130/80   Pulse (!) 103   Ht 5\' 3"  (1.6 m)   Wt 209 lb 6.4 oz (95 kg)   SpO2 95%   BMI 37.09 kg/m    Physical Exam Vitals reviewed.  Constitutional:      General: She is not in acute distress.    Appearance: Normal appearance. She is well-developed and normal weight. She is not ill-appearing, toxic-appearing or diaphoretic.  HENT:     Head: Normocephalic.     Right Ear: Tympanic membrane, ear canal and external ear normal. There is no impacted cerumen.     Left Ear: Tympanic membrane, ear canal and external ear normal. There is no impacted cerumen.  Nose: Nose normal.     Mouth/Throat:     Mouth: Mucous membranes are moist.     Pharynx: Oropharynx is clear.  Eyes:     General: No scleral icterus.       Right eye: No discharge.        Left eye: No discharge.     Conjunctiva/sclera: Conjunctivae normal.     Pupils: Pupils are equal, round, and reactive to light.  Neck:     Thyroid: No thyromegaly.     Vascular: No carotid bruit or JVD.  Cardiovascular:     Rate and Rhythm:  Normal rate and regular rhythm.     Heart sounds: Normal heart sounds.  Pulmonary:     Effort: Pulmonary effort is normal. No respiratory distress.     Breath sounds: Normal breath sounds.  Chest:  Breasts:    Breasts are symmetrical.     Right: Normal. No swelling, inverted nipple, mass, nipple discharge, skin change or tenderness.     Left: Normal. No swelling, inverted nipple, mass, nipple discharge, skin change or tenderness.  Abdominal:     General: Bowel sounds are normal.     Palpations: Abdomen is soft. There is no mass.     Tenderness: There is no abdominal tenderness. There is no guarding or rebound.     Hernia: There is no hernia in the left inguinal area or right inguinal area.  Genitourinary:    Exam position: Lithotomy position.     Pubic Area: No rash or pubic lice.      Labia:        Right: No rash, tenderness, lesion or injury.        Left: No rash, tenderness, lesion or injury.      Vagina: Normal.     Cervix: Normal.     Uterus: Normal.      Adnexa: Right adnexa normal and left adnexa normal.  Musculoskeletal:        General: Normal range of motion.     Cervical back: Normal range of motion and neck supple.  Lymphadenopathy:     Cervical: No cervical adenopathy.     Upper Body:     Right upper body: No supraclavicular, axillary or pectoral adenopathy.     Left upper body: No supraclavicular, axillary or pectoral adenopathy.     Lower Body: No right inguinal adenopathy. No left inguinal adenopathy.  Skin:    General: Skin is warm and dry.  Neurological:     General: No focal deficit present.     Mental Status: She is alert and oriented to person, place, and time. Mental status is at baseline.  Psychiatric:        Mood and Affect: Mood normal.        Behavior: Behavior normal.        Thought Content: Thought content normal.        Judgment: Judgment normal.    Assessment and Plan: Encounter for preventive health examination Assessment & Plan: age  appropriate education and counseling updated, referrals for preventative services and immunizations addressed, dietary and smoking counseling addressed, most recent labs reviewed.  I have personally reviewed and have noted:   1) the patient's medical and social history 2) The pt's use of alcohol, tobacco, and illicit drugs 3) The patient's current medications and supplements 4) Functional ability including ADL's, fall risk, home safety risk, hearing and visual impairment 5) Diet and physical activities 6) Evidence for depression or mood disorder 7) The  patient's height, weight, and BMI have been recorded in the chart   I have made referrals, and provided counseling and education based on review of the above    Cervical cancer screening -     Cytology - PAP  Essential hypertension, benign Assessment & Plan: She is at goal and tolerating hctz 25 mg daily   Orders: -     Basic metabolic panel  Obesity (BMI 30.0-34.9) Assessment & Plan: I have congratulated her in reduction of   BMI and encouraged  Continued weight loss with goal of 10% of body weigh over the next 6 months using a low glycemic index diet and regular exercise a minimum of 5 days per week.       Return in about 6 months (around 11/09/2023).  Sherlene Shams, MD

## 2023-05-09 NOTE — Assessment & Plan Note (Addendum)

## 2023-05-09 NOTE — Assessment & Plan Note (Signed)
She is at goal and tolerating hctz 25 mg daily

## 2023-05-09 NOTE — Assessment & Plan Note (Signed)
I have congratulated her in reduction of   BMI and encouraged  Continued weight loss with goal of 10% of body weigh over the next 6 months using a low glycemic index diet and regular exercise a minimum of 5 days per week.    

## 2023-05-10 LAB — BASIC METABOLIC PANEL
BUN: 15 mg/dL (ref 6–23)
CO2: 31 meq/L (ref 19–32)
Calcium: 10.1 mg/dL (ref 8.4–10.5)
Chloride: 97 meq/L (ref 96–112)
Creatinine, Ser: 0.83 mg/dL (ref 0.40–1.20)
GFR: 83.45 mL/min (ref >60.00)
Glucose, Bld: 92 mg/dL (ref 70–99)
Potassium: 4.4 meq/L (ref 3.5–5.1)
Sodium: 138 mEq/L (ref 135–145)

## 2023-05-15 LAB — CYTOLOGY - PAP
Comment: NEGATIVE
Diagnosis: NEGATIVE
High risk HPV: NEGATIVE

## 2023-08-25 ENCOUNTER — Encounter: Payer: Self-pay | Admitting: Family Medicine

## 2023-08-25 ENCOUNTER — Ambulatory Visit (INDEPENDENT_AMBULATORY_CARE_PROVIDER_SITE_OTHER): Payer: 59 | Admitting: Family Medicine

## 2023-08-25 VITALS — BP 128/73 | HR 98 | Temp 98.4°F | Ht 63.0 in | Wt 212.0 lb

## 2023-08-25 DIAGNOSIS — J0140 Acute pansinusitis, unspecified: Secondary | ICD-10-CM

## 2023-08-25 DIAGNOSIS — J329 Chronic sinusitis, unspecified: Secondary | ICD-10-CM | POA: Insufficient documentation

## 2023-08-25 MED ORDER — AMOXICILLIN-POT CLAVULANATE 875-125 MG PO TABS
1.0000 | ORAL_TABLET | Freq: Two times a day (BID) | ORAL | 0 refills | Status: DC
Start: 2023-08-25 — End: 2023-11-30

## 2023-08-25 NOTE — Assessment & Plan Note (Addendum)
Patient symptoms are consistent with sinusitis.  Will treat with Augmentin 1 tablet twice daily for 7 days.  If not improving by early next week she will let us know.  If she develops excessive diarrhea she will let us know.  If she has significant worsening of symptoms she will get reevaluated.

## 2023-08-25 NOTE — Progress Notes (Signed)
  Marikay Alar, MD Phone: [phone]  Nancy Leon is a 48 y.o. female who presents today for same-day visit.  Congestion: Patient notes onset of symptoms 17 days ago.  Had sore throat and then developed sneezing and congestion.  She started feel little bit better last week though is gotten worse again.  She is blowing clear alternating with thick yellow mucus out of her nose.  No fevers.  She has headache, fatigue, watery eyes, and ear pressure.  She has been taking Mucinex with some benefit though it is not resolving her symptoms.  Social History   Tobacco Use  Smoking Status Never  Smokeless Tobacco Never    Current Outpatient Medications on File Prior to Visit  Medication Sig Dispense Refill   hydrochlorothiazide (HYDRODIURIL) 25 MG tablet Take 1 tablet (25 mg total) by mouth daily. 90 tablet 3   loratadine (CLARITIN) 10 MG tablet Take 10 mg by mouth daily.     No current facility-administered medications on file prior to visit.     ROS see history of present illness  Objective  Physical Exam Vitals:   08/25/23 0940 08/25/23 0957  BP: 134/82 128/73  Pulse: 98   Temp: 98.4 F (36.9 C)   SpO2: 98%     BP Readings from Last 3 Encounters:  08/25/23 128/73  05/09/23 130/80  04/14/23 121/88   Wt Readings from Last 3 Encounters:  08/25/23 212 lb (96.2 kg)  05/09/23 209 lb 6.4 oz (95 kg)  04/14/23 206 lb (93.4 kg)    Physical Exam Constitutional:      General: She is not in acute distress.    Appearance: She is not diaphoretic.  HENT:     Head:     Comments: Maxillary sinus tenderness to percussion    Right Ear: Tympanic membrane normal.     Left Ear: Tympanic membrane normal.  Cardiovascular:     Rate and Rhythm: Normal rate and regular rhythm.     Heart sounds: Normal heart sounds.  Pulmonary:     Effort: Pulmonary effort is normal.     Breath sounds: Normal breath sounds.  Lymphadenopathy:     Cervical: No cervical adenopathy.   Neurological:     Mental Status: She is alert.      Assessment/Plan: Please see individual problem list.  Acute non-recurrent pansinusitis Assessment & Plan: Patient symptoms are consistent with sinusitis.  Will treat with Augmentin 1 tablet twice daily for 7 days.  If not improving by early next week she will let us know.  If she develops excessive diarrhea she will let us know.  If she has significant worsening of symptoms she will get reevaluated.  Orders: -     Amoxicillin-Pot Clavulanate; Take 1 tablet by mouth 2 (two) times daily.  Dispense: 14 tablet; Refill: 0     Return if symptoms worsen or fail to improve.   Marikay Alar, MD [facility] [place]

## 2023-11-09 ENCOUNTER — Ambulatory Visit: Payer: 59 | Admitting: Internal Medicine

## 2023-11-30 ENCOUNTER — Ambulatory Visit: Payer: 59 | Admitting: Internal Medicine

## 2023-11-30 ENCOUNTER — Encounter: Payer: Self-pay | Admitting: Internal Medicine

## 2023-11-30 VITALS — BP 130/86 | HR 88 | Ht 63.0 in | Wt 212.6 lb

## 2023-11-30 DIAGNOSIS — R7303 Prediabetes: Secondary | ICD-10-CM

## 2023-11-30 DIAGNOSIS — R635 Abnormal weight gain: Secondary | ICD-10-CM | POA: Diagnosis not present

## 2023-11-30 DIAGNOSIS — E66811 Obesity, class 1: Secondary | ICD-10-CM

## 2023-11-30 DIAGNOSIS — Z1231 Encounter for screening mammogram for malignant neoplasm of breast: Secondary | ICD-10-CM | POA: Diagnosis not present

## 2023-11-30 DIAGNOSIS — I10 Essential (primary) hypertension: Secondary | ICD-10-CM | POA: Diagnosis not present

## 2023-11-30 LAB — COMPREHENSIVE METABOLIC PANEL
ALT: 25 U/L (ref 0–35)
AST: 21 U/L (ref 0–37)
Albumin: 4.5 g/dL (ref 3.5–5.2)
Alkaline Phosphatase: 56 U/L (ref 39–117)
BUN: 12 mg/dL (ref 6–23)
CO2: 31 meq/L (ref 19–32)
Calcium: 10.1 mg/dL (ref 8.4–10.5)
Chloride: 98 meq/L (ref 96–112)
Creatinine, Ser: 0.92 mg/dL (ref 0.40–1.20)
GFR: 73.46 mL/min (ref >60.00)
Glucose, Bld: 96 mg/dL (ref 70–99)
Potassium: 4.2 meq/L (ref 3.5–5.1)
Sodium: 137 meq/L (ref 135–145)
Total Bilirubin: 0.5 mg/dL (ref 0.2–1.2)
Total Protein: 7.9 g/dL (ref 6.0–8.3)

## 2023-11-30 LAB — HEMOGLOBIN A1C: Hgb A1c MFr Bld: 6 % (ref 4.6–6.5)

## 2023-11-30 LAB — MICROALBUMIN / CREATININE URINE RATIO
Creatinine,U: 30.1 mg/dL
Microalb Creat Ratio: UNDETERMINED mg/g (ref 0.0–30.0)
Microalb, Ur: <0.7 mg/dL

## 2023-11-30 LAB — TSH: TSH: 1.35 u[IU]/mL (ref 0.35–5.50)

## 2023-11-30 NOTE — Assessment & Plan Note (Addendum)
 I have COUNSELLED  her in reduction of   BMI and encouraged  her to start exercising and continue caloric restriction  and low GI diet given A1c of 6.0  goal of 10% of body weight over the next 6 months using a low glycemic index diet and regular exercise a minimum of 5 days per week.    Lab Results  Component Value Date   TSH 1.35 11/30/2023   Lab Results  Component Value Date   HGBA1C 6.0 11/30/2023

## 2023-11-30 NOTE — Progress Notes (Signed)
 Subjective:  Patient ID: Nancy Leon, female    DOB: May 09, 1976  Age: 48 y.o. MRN: [id number]  CC: The primary encounter diagnosis was Essential hypertension, benign. Diagnoses of Encounter for screening mammogram for malignant neoplasm of breast, Prediabetes, Weight gain, and Obesity (BMI 30.0-34.9) were also pertinent to this visit.   HPI Nancy Leon presents for  Chief Complaint  Patient presents with   Medical Management of Chronic Issues    6 month follow up    1) Hypertension: patient checks blood pressure twice weekly at home.  Readings have been for the most part <130/80 at rest . Patient is following a reduced salt diet most days and is taking HCTZ as prescribed   2) morbid obesity :  she was evaluated with sleep study in 2014 (weight 207 lbs) for habitual snoring and it was NEGATIVE.    Not working out except yoga . Homeschools  both daughters.  Daughter is not overweight , but 37 yrs old,  recently diagnosed with PCOs and Charcot Marie Tooth Disease by podiatry so she is very concerned about her welfare      Outpatient Medications Prior to Visit  Medication Sig Dispense Refill   hydrochlorothiazide (HYDRODIURIL) 25 MG tablet Take 1 tablet (25 mg total) by mouth daily. 90 tablet 3   loratadine (CLARITIN) 10 MG tablet Take 10 mg by mouth daily.     amoxicillin-clavulanate (AUGMENTIN) 875-125 MG tablet Take 1 tablet by mouth 2 (two) times daily. (Patient not taking: Reported on 11/30/2023) 14 tablet 0   No facility-administered medications prior to visit.    Review of Systems;  Patient denies headache, fevers, malaise, unintentional weight loss, skin rash, eye pain, sinus congestion and sinus pain, sore throat, dysphagia,  hemoptysis , cough, dyspnea, wheezing, chest pain, palpitations, orthopnea, edema, abdominal pain, nausea, melena, diarrhea, constipation, flank pain, dysuria, hematuria, urinary  Frequency, nocturia, numbness, tingling, seizures,   Focal weakness, Loss of consciousness,  Tremor, insomnia, depression, anxiety, and suicidal ideation.      Objective:  BP 130/86   Pulse 88   Ht 5\' 3"  (1.6 m)   Wt 212 lb 9.6 oz (96.4 kg)   SpO2 95%   BMI 37.66 kg/m   BP Readings from Last 3 Encounters:  11/30/23 130/86  08/25/23 128/73  05/09/23 130/80    Wt Readings from Last 3 Encounters:  11/30/23 212 lb 9.6 oz (96.4 kg)  08/25/23 212 lb (96.2 kg)  05/09/23 209 lb 6.4 oz (95 kg)    Physical Exam Vitals reviewed.  Constitutional:      General: She is not in acute distress.    Appearance: Normal appearance. She is obese. She is not ill-appearing, toxic-appearing or diaphoretic.  HENT:     Head: Normocephalic.  Eyes:     General: No scleral icterus.       Right eye: No discharge.        Left eye: No discharge.     Conjunctiva/sclera: Conjunctivae normal.  Cardiovascular:     Rate and Rhythm: Normal rate and regular rhythm.     Heart sounds: Normal heart sounds.  Pulmonary:     Effort: Pulmonary effort is normal. No respiratory distress.     Breath sounds: Normal breath sounds.  Musculoskeletal:        General: Normal range of motion.  Skin:    General: Skin is warm and dry.  Neurological:     General: No focal deficit present.     Mental Status:  She is alert and oriented to person, place, and time. Mental status is at baseline.  Psychiatric:        Mood and Affect: Mood normal.        Behavior: Behavior normal.        Thought Content: Thought content normal.        Judgment: Judgment normal.    Lab Results  Component Value Date   HGBA1C 6.0 11/30/2023   HGBA1C 5.7 02/06/2023   HGBA1C 5.9 05/09/2019    Lab Results  Component Value Date   CREATININE 0.92 11/30/2023   CREATININE 0.83 05/09/2023   CREATININE 0.82 02/06/2023    Lab Results  Component Value Date   WBC 8.0 02/06/2023   HGB 12.2 02/06/2023   HCT 36.0 02/06/2023   PLT 321.0 02/06/2023   GLUCOSE 96 11/30/2023   CHOL 233 (H)  02/06/2023   TRIG 223.0 (H) 02/06/2023   HDL 55.10 02/06/2023   LDLDIRECT 147.0 02/06/2023   LDLCALC 108 (H) 04/01/2019   ALT 25 11/30/2023   AST 21 11/30/2023   NA 137 11/30/2023   K 4.2 11/30/2023   CL 98 11/30/2023   CREATININE 0.92 11/30/2023   BUN 12 11/30/2023   CO2 31 11/30/2023   TSH 1.35 11/30/2023   HGBA1C 6.0 11/30/2023   MICROALBUR <0.7 11/30/2023    No results found.  Assessment & Plan:  .Essential hypertension, benign Assessment & Plan: She is not at goal but tolerating hctz 25 mg daily . Home readings needed, she has no proteinuria .  Will add amlodipine if home readings are elevated.  Lab Results  Component Value Date   MICROALBUR <0.7 11/30/2023   MICROALBUR <0.7 02/06/2023       Orders: -     Comprehensive metabolic panel -     Microalbumin / creatinine urine ratio  Encounter for screening mammogram for malignant neoplasm of breast -     3D Screening Mammogram, Left and Right; Future  Prediabetes -     Hemoglobin A1c  Weight gain -     TSH  Obesity (BMI 30.0-34.9) Assessment & Plan: I have COUNSELLED  her in reduction of   BMI and encouraged  her to start exercising and continue caloric restriction  and low GI diet given A1c of 6.0  goal of 10% of body weight over the next 6 months using a low glycemic index diet and regular exercise a minimum of 5 days per week.    Lab Results  Component Value Date   TSH 1.35 11/30/2023   Lab Results  Component Value Date   HGBA1C 6.0 11/30/2023         I spent 34 minutes on the day of this face to face encounter reviewing patient's  recent  labs and imaging studies, counseling on weight management,  reviewing the assessment and plan with patient, and post visit ordering and reviewing of  diagnostics and therapeutics with patient  .   Follow-up: Return in about 6 months (around 06/01/2024).   Sherlene Shams, MD

## 2023-11-30 NOTE — Patient Instructions (Addendum)
 YOUR MAMMOGRAM IS DUE June 6th, PLEASE CALL AND GET THIS SCHEDULED! [facility] - call [phone]   By now you have probably received the message from [facility] about a miscalculation on the test that measures the albumin to creatinine ratio which helps [place] determine if early kidney disease is present. I have reviewed your most recent screening test for nephropathy, which was done at your last visit and it was NORMAL.  THE MESSAGE DOES NOT APPLY TO YOU,     Please check your blood pressure  ONCE A WEEK at home and send me the readings  in one month so I can determine if you need to start an additional  medication to lower your blood pressure .     I  applaud your determination to lose weight on your own.  1400 calories will work IF YOU START BURNING CALORIES WITH EXERCISE,  BUT Here are some prepared foods that will help keep you from blowing your diet  ON "HIDDEN CALORIE" MEALS, because:   Oatmeal has more carbs and less fiber than you think.Marland KitchenMarland KitchenRead the label     [place] yogurt is loaded with sugar unless you choose unflavored (NOT VANILLA ) .   TRY one of these brands that use artificial or natural low glycemic index sweeteners:  Dannon Lt n Fit Greek 2 Good 2 Be True Oikos Triple Zero   You can also  a premixed protein drink   Premier Protein' Fairlife Muscle Milk Atkins Aadvantage   .    Vilma Meckel and Veggies made Randie [employer] are two companies that make individual servings of  low carb frittata's (quiche without the crust)   RealGood has a line of low carb entree's  and "fun food"  including enchiladas and  bacon wrapped jalapeno's,   Healthy Choice  "low carb power bowl"  entrees and  "Steamer" entrees are are great low carb entrees that microwave in 5   MIN   AND aure usually < 250  CAL  and < 12 NET CARBS   Healthy /choice makes low cal/carb fudgsicles and brownies  , and KIND makes low carb mini sized bars  for snacks

## 2023-11-30 NOTE — Assessment & Plan Note (Addendum)
 She is not at goal but tolerating hctz 25 mg daily . Home readings needed, she has no proteinuria .  Will add amlodipine if home readings are elevated.  Lab Results  Component Value Date   MICROALBUR <0.7 11/30/2023   MICROALBUR <0.7 02/06/2023

## 2024-01-01 ENCOUNTER — Encounter: Payer: Self-pay | Admitting: Internal Medicine

## 2024-01-29 ENCOUNTER — Other Ambulatory Visit: Payer: Self-pay | Admitting: Internal Medicine

## 2024-02-26 ENCOUNTER — Ambulatory Visit
Admission: RE | Admit: 2024-02-26 | Discharge: 2024-02-26 | Disposition: A | Discharge status: Home / Self Care | Source: Ambulatory Visit | Attending: Internal Medicine | Admitting: Internal Medicine

## 2024-02-26 DIAGNOSIS — Z1231 Encounter for screening mammogram for malignant neoplasm of breast: Secondary | ICD-10-CM | POA: Insufficient documentation

## 2024-04-26 ENCOUNTER — Other Ambulatory Visit: Payer: Self-pay | Admitting: Internal Medicine

## 2024-06-03 ENCOUNTER — Ambulatory Visit: Admitting: Internal Medicine

## 2024-07-19 ENCOUNTER — Encounter: Payer: Self-pay | Admitting: Internal Medicine

## 2024-07-19 ENCOUNTER — Ambulatory Visit: Admitting: Internal Medicine

## 2024-07-19 VITALS — BP 128/76 | HR 95 | Ht 63.0 in | Wt 210.6 lb

## 2024-07-19 DIAGNOSIS — R7303 Prediabetes: Secondary | ICD-10-CM

## 2024-07-19 DIAGNOSIS — E66811 Obesity, class 1: Secondary | ICD-10-CM

## 2024-07-19 DIAGNOSIS — R5383 Other fatigue: Secondary | ICD-10-CM

## 2024-07-19 DIAGNOSIS — I10 Essential (primary) hypertension: Secondary | ICD-10-CM

## 2024-07-19 DIAGNOSIS — Z0000 Encounter for general adult medical examination without abnormal findings: Secondary | ICD-10-CM

## 2024-07-19 DIAGNOSIS — E785 Hyperlipidemia, unspecified: Secondary | ICD-10-CM | POA: Diagnosis not present

## 2024-07-19 MED ORDER — HYDROCHLOROTHIAZIDE 25 MG PO TABS: ORAL_TABLET | ORAL | 1 refills | Status: AC

## 2024-07-19 NOTE — Progress Notes (Deleted)
 Subjective:  Patient ID: Nancy Leon, female    DOB: July 05, 1976  Age: 48 y.o. MRN: [id number]  CC: The primary encounter diagnosis was Essential hypertension, benign. Diagnoses of Prediabetes, Hyperlipidemia, unspecified hyperlipidemia type, and Other fatigue were also pertinent to this visit.   HPI Nancy Leon presents for  Chief Complaint  Patient presents with   Medical Management of Chronic Issues    6 month follow up       Outpatient Medications Prior to Visit  Medication Sig Dispense Refill   hydrochlorothiazide  (HYDRODIURIL ) 25 MG tablet TAKE 1 TABLET(25 MG) BY MOUTH DAILY 90 tablet 0   loratadine (CLARITIN) 10 MG tablet Take 10 mg by mouth daily.     No facility-administered medications prior to visit.    Review of Systems;  Patient denies headache, fevers, malaise, unintentional weight loss, skin rash, eye pain, sinus congestion and sinus pain, sore throat, dysphagia,  hemoptysis , cough, dyspnea, wheezing, chest pain, palpitations, orthopnea, edema, abdominal pain, nausea, melena, diarrhea, constipation, flank pain, dysuria, hematuria, urinary  Frequency, nocturia, numbness, tingling, seizures,  Focal weakness, Loss of consciousness,  Tremor, insomnia, depression, anxiety, and suicidal ideation.      Objective:  BP 128/76   Pulse 95   Ht 5' 3 (1.6 m)   Wt 210 lb 9.6 oz (95.5 kg)   SpO2 97%   BMI 37.31 kg/m   BP Readings from Last 3 Encounters:  07/19/24 128/76  11/30/23 130/86  08/25/23 128/73    Wt Readings from Last 3 Encounters:  07/19/24 210 lb 9.6 oz (95.5 kg)  11/30/23 212 lb 9.6 oz (96.4 kg)  08/25/23 212 lb (96.2 kg)    Physical Exam  Lab Results  Component Value Date   HGBA1C 6.0 11/30/2023   HGBA1C 5.7 02/06/2023   HGBA1C 5.9 05/09/2019    Lab Results  Component Value Date   CREATININE 0.92 11/30/2023   CREATININE 0.83 05/09/2023   CREATININE 0.82 02/06/2023    Lab Results  Component Value Date   WBC  8.0 02/06/2023   HGB 12.2 02/06/2023   HCT 36.0 02/06/2023   PLT 321.0 02/06/2023   GLUCOSE 96 11/30/2023   CHOL 233 (H) 02/06/2023   TRIG 223.0 (H) 02/06/2023   HDL 55.10 02/06/2023   LDLDIRECT 147.0 02/06/2023   LDLCALC 108 (H) 04/01/2019   ALT 25 11/30/2023   AST 21 11/30/2023   NA 137 11/30/2023   K 4.2 11/30/2023   CL 98 11/30/2023   CREATININE 0.92 11/30/2023   BUN 12 11/30/2023   CO2 31 11/30/2023   TSH 1.35 11/30/2023   HGBA1C 6.0 11/30/2023   MICROALBUR <0.7 11/30/2023    MM 3D SCREENING MAMMOGRAM BILATERAL BREAST Result Date: 02/29/2024 CLINICAL DATA:  Screening. EXAM: DIGITAL SCREENING BILATERAL MAMMOGRAM WITH TOMOSYNTHESIS AND CAD TECHNIQUE: Bilateral screening digital craniocaudal and mediolateral oblique mammograms were obtained. Bilateral screening digital breast tomosynthesis was performed. The images were evaluated with computer-aided detection. COMPARISON:  Previous exam(s). ACR Breast Density Category b: There are scattered areas of fibroglandular density. FINDINGS: There are no findings suspicious for malignancy. IMPRESSION: No mammographic evidence of malignancy. A result letter of this screening mammogram will be mailed directly to the patient. RECOMMENDATION: Screening mammogram in one year. (Code:SM-B-01Y) BI-RADS CATEGORY  1: Negative. Electronically Signed   By: Toribio Agreste M.D.   On: 02/29/2024 09:21    Assessment & Plan:  .Essential hypertension, benign  Prediabetes  Hyperlipidemia, unspecified hyperlipidemia type  Other fatigue     I  spent 34 minutes on the day of this face to face encounter reviewing patient's  most recent visit with cardiology,  nephrology,  and neurology,  prior relevant surgical and non surgical procedures, recent  labs and imaging studies, counseling on weight management,  reviewing the assessment and plan with patient, and post visit ordering and reviewing of  diagnostics and therapeutics with patient  .   Follow-up: No  follow-ups on file.   Verneita LITTIE Kettering, MD

## 2024-07-19 NOTE — Patient Instructions (Signed)
 PORTION CONTROL IS EVERYTHING!!!

## 2024-07-20 LAB — HEMOGLOBIN A1C
Est. average glucose Bld gHb Est-mCnc: 123 mg/dL
Hgb A1c MFr Bld: 5.9 % — ABNORMAL HIGH (ref 4.8–5.6)

## 2024-07-20 LAB — LIPID PANEL
Chol/HDL Ratio: 4.7 ratio — ABNORMAL HIGH (ref 0.0–4.4)
Cholesterol, Total: 254 mg/dL — ABNORMAL HIGH (ref 100–199)
HDL: 54 mg/dL (ref >39)
LDL Chol Calc (NIH): 167 mg/dL — ABNORMAL HIGH (ref 0–99)
Triglycerides: 179 mg/dL — ABNORMAL HIGH (ref 0–149)
VLDL Cholesterol Cal: 33 mg/dL (ref 5–40)

## 2024-07-20 LAB — CBC WITH DIFFERENTIAL/PLATELET
Basophils Absolute: 0.1 x10E3/uL (ref 0.0–0.2)
Basos: 1 %
EOS (ABSOLUTE): 0.2 x10E3/uL (ref 0.0–0.4)
Eos: 3 %
Hematocrit: 38.2 % (ref 34.0–46.6)
Hemoglobin: 12.5 g/dL (ref 11.1–15.9)
Immature Grans (Abs): 0 x10E3/uL (ref 0.0–0.1)
Immature Granulocytes: 0 %
Lymphocytes Absolute: 2.5 x10E3/uL (ref 0.7–3.1)
Lymphs: 32 %
MCH: 28.1 pg (ref 26.6–33.0)
MCHC: 32.7 g/dL (ref 31.5–35.7)
MCV: 86 fL (ref 79–97)
Monocytes Absolute: 0.5 x10E3/uL (ref 0.1–0.9)
Monocytes: 6 %
Neutrophils Absolute: 4.6 x10E3/uL (ref 1.4–7.0)
Neutrophils: 58 %
Platelets: 362 x10E3/uL (ref 150–450)
RBC: 4.45 x10E6/uL (ref 3.77–5.28)
RDW: 13.8 % (ref 11.7–15.4)
WBC: 8 x10E3/uL (ref 3.4–10.8)

## 2024-07-20 LAB — COMPREHENSIVE METABOLIC PANEL WITH GFR
ALT: 35 IU/L — ABNORMAL HIGH (ref 0–32)
AST: 24 IU/L (ref 0–40)
Albumin: 4.8 g/dL (ref 3.9–4.9)
Alkaline Phosphatase: 64 IU/L (ref 41–116)
BUN/Creatinine Ratio: 11 (ref 9–23)
BUN: 9 mg/dL (ref 6–24)
Bilirubin Total: 0.4 mg/dL (ref 0.0–1.2)
CO2: 26 mmol/L (ref 20–29)
Calcium: 10.4 mg/dL — ABNORMAL HIGH (ref 8.7–10.2)
Chloride: 97 mmol/L (ref 96–106)
Creatinine, Ser: 0.79 mg/dL (ref 0.57–1.00)
Globulin, Total: 3 g/dL (ref 1.5–4.5)
Glucose: 86 mg/dL (ref 70–99)
Potassium: 4.2 mmol/L (ref 3.5–5.2)
Sodium: 139 mmol/L (ref 134–144)
Total Protein: 7.8 g/dL (ref 6.0–8.5)
eGFR: 92 mL/min/1.73 (ref >59)

## 2024-07-20 LAB — TSH: TSH: 1.86 u[IU]/mL (ref 0.450–4.500)

## 2024-07-20 LAB — LDL CHOLESTEROL, DIRECT: LDL Direct: 171 mg/dL — ABNORMAL HIGH (ref 0–99)

## 2024-07-21 ENCOUNTER — Ambulatory Visit: Payer: Self-pay | Admitting: Internal Medicine

## 2024-07-21 ENCOUNTER — Encounter: Payer: Self-pay | Admitting: Internal Medicine

## 2024-07-21 DIAGNOSIS — E785 Hyperlipidemia, unspecified: Secondary | ICD-10-CM | POA: Insufficient documentation

## 2024-07-21 NOTE — Assessment & Plan Note (Signed)
 10 yr risk of CAD using the AHA risk calculator is 2%.  Patient has no incidental evidence of atherosclerosis .  No treatment advised at this time   Lab Results  Component Value Date   CHOL 254 (H) 07/19/2024   HDL 54 07/19/2024   LDLCALC 167 (H) 07/19/2024   LDLDIRECT 171 (H) 07/19/2024   TRIG 179 (H) 07/19/2024   CHOLHDL 4.7 (H) 07/19/2024

## 2024-07-21 NOTE — Assessment & Plan Note (Signed)

## 2024-07-21 NOTE — Assessment & Plan Note (Signed)
 Well controlled on hcz 25 mg daily . Renal function stable, no changes today.   Lab Results  Component Value Date   CREATININE 0.79 07/19/2024   Lab Results  Component Value Date   NA 139 07/19/2024   K 4.2 07/19/2024   CL 97 07/19/2024   CO2 26 07/19/2024

## 2024-07-21 NOTE — Progress Notes (Signed)
 Patient ID: Nancy Leon, female    DOB: [birth year]  Age: 48 y.o. MRN: [id number]  The patient is here for annual preventive examination and management of other chronic and acute problems.   The risk factors are reflected in the social history.   The roster of all physicians providing medical care to patient - is listed in the Snapshot section of the chart.   Activities of daily living:  The patient is 100% independent in all ADLs: dressing, toileting, feeding as well as independent mobility   Home safety : The patient has smoke detectors in the home. They wear seatbelts.  There are no unsecured firearms at home. There is no violence in the home.    There is no risks for hepatitis, STDs or HIV. There is no   history of blood transfusion. They have no travel history to infectious disease endemic areas of the world.   The patient has seen their dentist in the last six month. They have seen their eye doctor in the last year. The patinet  denies slight hearing difficulty with regard to whispered voices and some television programs.  They have deferred audiologic testing in the last year.  They do not  have excessive sun exposure. Discussed the need for sun protection: hats, long sleeves and use of sunscreen if there is significant sun exposure.    Diet: the importance of a healthy diet is discussed. They do have a healthy diet.   The benefits of regular aerobic exercise were discussed. The patient  exercises  3 to 5 days per week  for  60 minutes.    Depression screen: there are no signs or vegative symptoms of depression- irritability, change in appetite, anhedonia, sadness/tearfullness.   The following portions of the patient's history were reviewed and updated as appropriate: allergies, current medications, past family history, past medical history,  past surgical history, past social history  and problem list.   Visual acuity was not assessed per patient preference since the patient  has regular follow up with an  ophthalmologist. Hearing and body mass index were assessed and reviewed.    During the course of the visit the patient was educated and counseled about appropriate screening and preventive services including : fall prevention , diabetes screening, nutrition counseling, colorectal cancer screening, and recommended immunizations.    Chief Complaint:   1) Hypertension: patient checks blood pressure twice weekly at home.  Readings have been for the most part <130/80 at rest . Patient is following a reduced salt diet most days and is taking medications as prescribed      Review of Symptoms  Patient denies headache, fevers, malaise, unintentional weight loss, skin rash, eye pain, sinus congestion and sinus pain, sore throat, dysphagia,  hemoptysis , cough, dyspnea, wheezing, chest pain, palpitations, orthopnea, edema, abdominal pain, nausea, melena, diarrhea, constipation, flank pain, dysuria, hematuria, urinary  Frequency, nocturia, numbness, tingling, seizures,  Focal weakness, Loss of consciousness,  Tremor, insomnia, depression, anxiety, and suicidal ideation.    Physical Exam:  BP 128/76   Pulse 95   Ht 5' 3 (1.6 m)   Wt 210 lb 9.6 oz (95.5 kg)   SpO2 97%   BMI 37.31 kg/m    Physical Exam Vitals reviewed.  Constitutional:      General: She is not in acute distress.    Appearance: Normal appearance. She is normal weight. She is not ill-appearing, toxic-appearing or diaphoretic.  HENT:     Head: Normocephalic.  Eyes:  General: No scleral icterus.       Right eye: No discharge.        Left eye: No discharge.     Conjunctiva/sclera: Conjunctivae normal.  Cardiovascular:     Rate and Rhythm: Normal rate and regular rhythm.     Heart sounds: Normal heart sounds.  Pulmonary:     Effort: Pulmonary effort is normal. No respiratory distress.     Breath sounds: Normal breath sounds.  Musculoskeletal:        General: Normal range of motion.  Skin:     General: Skin is warm and dry.  Neurological:     General: No focal deficit present.     Mental Status: She is alert and oriented to person, place, and time. Mental status is at baseline.  Psychiatric:        Mood and Affect: Mood normal.        Behavior: Behavior normal.        Thought Content: Thought content normal.        Judgment: Judgment normal.     Assessment and Plan: Essential hypertension, benign Assessment & Plan: Well controlled on hcz 25 mg daily . Renal function stable, no changes today.   Lab Results  Component Value Date   CREATININE 0.79 07/19/2024   Lab Results  Component Value Date   NA 139 07/19/2024   K 4.2 07/19/2024   CL 97 07/19/2024   CO2 26 07/19/2024     Orders: -     Comprehensive metabolic panel with GFR  Prediabetes -     Comprehensive metabolic panel with GFR -     Hemoglobin A1c  Hyperlipidemia, unspecified hyperlipidemia type -     Lipid panel -     LDL cholesterol, direct  Other fatigue -     CBC with Differential/Platelet -     TSH  Obesity (BMI 30.0-34.9) Assessment & Plan: I have encouraged patient  to continue  working to reduce her  BMI  through regular participation in low impact aerobic exercise and portion reduction  using a low GI diet given A1c of 5.9 with   goal of  losing 10% of body weight over the next 6 months  Lab Results  Component Value Date   TSH 1.860 07/19/2024   Lab Results  Component Value Date   HGBA1C 5.9 (H) 07/19/2024      Encounter for preventive health examination Assessment & Plan: age appropriate education and counseling updated, referrals for preventative services and immunizations addressed, dietary and smoking counseling addressed, most recent labs reviewed.  I have personally reviewed and have noted:   1) the patient's medical and social history 2) The pt's use of alcohol, tobacco, and illicit drugs 3) The patient's current medications and supplements 4) Functional ability  including ADL's, fall risk, home safety risk, hearing and visual impairment 5) Diet and physical activities 6) Evidence for depression or mood disorder 7) The patient's height, weight, and BMI have been recorded in the chart    I have made referrals, and provided counseling and education based on review of the above    Hyperlipidemia with target low density lipoprotein (LDL) cholesterol less than 160 mg/dL Assessment & Plan: 10 yr risk of CAD using the AHA risk calculator is 2%.  Patient has no incidental evidence of atherosclerosis .  No treatment advised at this time   Lab Results  Component Value Date   CHOL 254 (H) 07/19/2024   HDL 54 07/19/2024  LDLCALC 167 (H) 07/19/2024   LDLDIRECT 171 (H) 07/19/2024   TRIG 179 (H) 07/19/2024   CHOLHDL 4.7 (H) 07/19/2024      Other orders -     hydroCHLOROthiazide ; TAKE 1 TABLET(25 MG) BY MOUTH DAILY  Dispense: 90 tablet; Refill: 1    Return in about 6 months (around 01/16/2025).  Verneita LITTIE Kettering, MD

## 2024-07-21 NOTE — Assessment & Plan Note (Signed)
 I have encouraged patient  to continue  working to reduce her  BMI  through regular participation in low impact aerobic exercise and portion reduction  using a low GI diet given A1c of 5.9 with   goal of  losing 10% of body weight over the next 6 months  Lab Results  Component Value Date   TSH 1.860 07/19/2024   Lab Results  Component Value Date   HGBA1C 5.9 (H) 07/19/2024

## 2025-01-20 ENCOUNTER — Ambulatory Visit: Admitting: Internal Medicine
